# Patient Record
Sex: Female | Born: 1935 | Race: Black or African American | Hispanic: No | Marital: Married | Smoking: Never smoker
Health system: Southern US, Community
[De-identification: ages and names within clinical notes are randomized; demographics above are authoritative.]

## PROBLEM LIST (undated history)

## (undated) DIAGNOSIS — I4891 Unspecified atrial fibrillation: Secondary | ICD-10-CM

## (undated) DIAGNOSIS — R569 Unspecified convulsions: Secondary | ICD-10-CM

## (undated) DIAGNOSIS — I639 Cerebral infarction, unspecified: Secondary | ICD-10-CM

## (undated) DIAGNOSIS — I10 Essential (primary) hypertension: Secondary | ICD-10-CM

## (undated) HISTORY — PX: NO PAST SURGERIES: SHX2092

---

## 2009-05-17 ENCOUNTER — Emergency Department (HOSPITAL_COMMUNITY): Admission: EM | Admit: 2009-05-17 | Discharge: 2009-05-17 | Payer: Self-pay | Admitting: Emergency Medicine

## 2010-07-28 LAB — BASIC METABOLIC PANEL
CO2: 23 mEq/L (ref 19–32)
Calcium: 9.3 mg/dL (ref 8.4–10.5)
GFR calc Af Amer: 53 mL/min — ABNORMAL LOW (ref >60)
GFR calc non Af Amer: 44 mL/min — ABNORMAL LOW (ref >60)
Sodium: 139 mEq/L (ref 135–145)

## 2016-03-19 ENCOUNTER — Emergency Department (HOSPITAL_COMMUNITY): Payer: Medicare Other

## 2016-03-19 ENCOUNTER — Encounter (HOSPITAL_COMMUNITY): Payer: Self-pay | Admitting: Emergency Medicine

## 2016-03-19 ENCOUNTER — Inpatient Hospital Stay (HOSPITAL_COMMUNITY)
Admission: EM | Admit: 2016-03-19 | Discharge: 2016-03-25 | DRG: 064 | Disposition: A | Discharge status: Skilled Nursing Facility | Payer: Medicare Other | Attending: Internal Medicine | Admitting: Internal Medicine

## 2016-03-19 ENCOUNTER — Inpatient Hospital Stay (HOSPITAL_COMMUNITY): Payer: Medicare Other

## 2016-03-19 DIAGNOSIS — Z823 Family history of stroke: Secondary | ICD-10-CM | POA: Diagnosis not present

## 2016-03-19 DIAGNOSIS — H53461 Homonymous bilateral field defects, right side: Secondary | ICD-10-CM | POA: Diagnosis present

## 2016-03-19 DIAGNOSIS — R400 Somnolence: Secondary | ICD-10-CM | POA: Diagnosis not present

## 2016-03-19 DIAGNOSIS — Z8249 Family history of ischemic heart disease and other diseases of the circulatory system: Secondary | ICD-10-CM | POA: Diagnosis not present

## 2016-03-19 DIAGNOSIS — R238 Other skin changes: Secondary | ICD-10-CM | POA: Diagnosis present

## 2016-03-19 DIAGNOSIS — I69351 Hemiplegia and hemiparesis following cerebral infarction affecting right dominant side: Secondary | ICD-10-CM

## 2016-03-19 DIAGNOSIS — I6789 Other cerebrovascular disease: Secondary | ICD-10-CM | POA: Diagnosis not present

## 2016-03-19 DIAGNOSIS — R4701 Aphasia: Secondary | ICD-10-CM | POA: Diagnosis present

## 2016-03-19 DIAGNOSIS — K439 Ventral hernia without obstruction or gangrene: Secondary | ICD-10-CM | POA: Diagnosis present

## 2016-03-19 DIAGNOSIS — I609 Nontraumatic subarachnoid hemorrhage, unspecified: Secondary | ICD-10-CM | POA: Diagnosis present

## 2016-03-19 DIAGNOSIS — I63512 Cerebral infarction due to unspecified occlusion or stenosis of left middle cerebral artery: Secondary | ICD-10-CM | POA: Diagnosis not present

## 2016-03-19 DIAGNOSIS — S81801A Unspecified open wound, right lower leg, initial encounter: Secondary | ICD-10-CM | POA: Diagnosis not present

## 2016-03-19 DIAGNOSIS — G8194 Hemiplegia, unspecified affecting left nondominant side: Secondary | ICD-10-CM | POA: Diagnosis present

## 2016-03-19 DIAGNOSIS — Z7901 Long term (current) use of anticoagulants: Secondary | ICD-10-CM | POA: Diagnosis not present

## 2016-03-19 DIAGNOSIS — Z515 Encounter for palliative care: Secondary | ICD-10-CM | POA: Diagnosis present

## 2016-03-19 DIAGNOSIS — S99921A Unspecified injury of right foot, initial encounter: Secondary | ICD-10-CM | POA: Diagnosis present

## 2016-03-19 DIAGNOSIS — Z7189 Other specified counseling: Secondary | ICD-10-CM | POA: Diagnosis not present

## 2016-03-19 DIAGNOSIS — I130 Hypertensive heart and chronic kidney disease with heart failure and stage 1 through stage 4 chronic kidney disease, or unspecified chronic kidney disease: Secondary | ICD-10-CM | POA: Diagnosis present

## 2016-03-19 DIAGNOSIS — Z66 Do not resuscitate: Secondary | ICD-10-CM | POA: Diagnosis not present

## 2016-03-19 DIAGNOSIS — S9781XA Crushing injury of right foot, initial encounter: Secondary | ICD-10-CM | POA: Diagnosis present

## 2016-03-19 DIAGNOSIS — I63412 Cerebral infarction due to embolism of left middle cerebral artery: Secondary | ICD-10-CM | POA: Diagnosis present

## 2016-03-19 DIAGNOSIS — R1310 Dysphagia, unspecified: Secondary | ICD-10-CM | POA: Diagnosis present

## 2016-03-19 DIAGNOSIS — N183 Chronic kidney disease, stage 3 unspecified: Secondary | ICD-10-CM | POA: Diagnosis present

## 2016-03-19 DIAGNOSIS — I272 Pulmonary hypertension, unspecified: Secondary | ICD-10-CM | POA: Diagnosis present

## 2016-03-19 DIAGNOSIS — I4891 Unspecified atrial fibrillation: Secondary | ICD-10-CM | POA: Diagnosis not present

## 2016-03-19 DIAGNOSIS — D649 Anemia, unspecified: Secondary | ICD-10-CM | POA: Diagnosis present

## 2016-03-19 DIAGNOSIS — R569 Unspecified convulsions: Secondary | ICD-10-CM | POA: Diagnosis present

## 2016-03-19 DIAGNOSIS — I480 Paroxysmal atrial fibrillation: Secondary | ICD-10-CM | POA: Diagnosis present

## 2016-03-19 DIAGNOSIS — I639 Cerebral infarction, unspecified: Secondary | ICD-10-CM

## 2016-03-19 DIAGNOSIS — W19XXXD Unspecified fall, subsequent encounter: Secondary | ICD-10-CM | POA: Diagnosis not present

## 2016-03-19 DIAGNOSIS — M8580 Other specified disorders of bone density and structure, unspecified site: Secondary | ICD-10-CM | POA: Diagnosis present

## 2016-03-19 DIAGNOSIS — Z8673 Personal history of transient ischemic attack (TIA), and cerebral infarction without residual deficits: Secondary | ICD-10-CM

## 2016-03-19 DIAGNOSIS — I700 Atherosclerosis of aorta: Secondary | ICD-10-CM | POA: Diagnosis present

## 2016-03-19 DIAGNOSIS — W230XXA Caught, crushed, jammed, or pinched between moving objects, initial encounter: Secondary | ICD-10-CM | POA: Diagnosis present

## 2016-03-19 DIAGNOSIS — I4581 Long QT syndrome: Secondary | ICD-10-CM | POA: Diagnosis present

## 2016-03-19 DIAGNOSIS — S90521D Blister (nonthermal), right ankle, subsequent encounter: Secondary | ICD-10-CM | POA: Diagnosis not present

## 2016-03-19 DIAGNOSIS — E785 Hyperlipidemia, unspecified: Secondary | ICD-10-CM | POA: Diagnosis present

## 2016-03-19 DIAGNOSIS — S90521A Blister (nonthermal), right ankle, initial encounter: Secondary | ICD-10-CM | POA: Diagnosis not present

## 2016-03-19 DIAGNOSIS — I5022 Chronic systolic (congestive) heart failure: Secondary | ICD-10-CM | POA: Diagnosis present

## 2016-03-19 DIAGNOSIS — R0602 Shortness of breath: Secondary | ICD-10-CM

## 2016-03-19 DIAGNOSIS — R600 Localized edema: Secondary | ICD-10-CM

## 2016-03-19 HISTORY — DX: Unspecified convulsions: R56.9

## 2016-03-19 HISTORY — DX: Cerebral infarction, unspecified: I63.9

## 2016-03-19 HISTORY — DX: Essential (primary) hypertension: I10

## 2016-03-19 HISTORY — DX: Unspecified atrial fibrillation: I48.91

## 2016-03-19 LAB — RAPID URINE DRUG SCREEN, HOSP PERFORMED
AMPHETAMINES: NOT DETECTED
BENZODIAZEPINES: NOT DETECTED
Barbiturates: NOT DETECTED
Cocaine: NOT DETECTED
OPIATES: POSITIVE — AB
TETRAHYDROCANNABINOL: NOT DETECTED

## 2016-03-19 LAB — DIFFERENTIAL
Basophils Absolute: 0.1 K/uL (ref 0.0–0.1)
Basophils Relative: 1 %
Eosinophils Absolute: 0 K/uL (ref 0.0–0.7)
Eosinophils Relative: 0 %
Lymphocytes Relative: 10 %
Lymphs Abs: 1 K/uL (ref 0.7–4.0)
Monocytes Absolute: 0.5 K/uL (ref 0.1–1.0)
Monocytes Relative: 5 %
Neutro Abs: 8.2 K/uL — ABNORMAL HIGH (ref 1.7–7.7)
Neutrophils Relative %: 84 %

## 2016-03-19 LAB — CBC
HEMATOCRIT: 31.4 % — AB (ref 36.0–46.0)
Hemoglobin: 10.4 g/dL — ABNORMAL LOW (ref 12.0–15.0)
MCH: 29.7 pg (ref 26.0–34.0)
MCHC: 33.1 g/dL (ref 30.0–36.0)
MCV: 89.7 fL (ref 78.0–100.0)
PLATELETS: 241 10*3/uL (ref 150–400)
RBC: 3.5 MIL/uL — ABNORMAL LOW (ref 3.87–5.11)
RDW: 16.1 % — AB (ref 11.5–15.5)
WBC: 9.8 10*3/uL (ref 4.0–10.5)

## 2016-03-19 LAB — I-STAT TROPONIN, ED: Troponin i, poc: 0 ng/mL (ref 0.00–0.08)

## 2016-03-19 LAB — I-STAT CHEM 8, ED
BUN: 31 mg/dL — ABNORMAL HIGH (ref 6–20)
Calcium, Ion: 1.11 mmol/L — ABNORMAL LOW (ref 1.15–1.40)
Chloride: 110 mmol/L (ref 101–111)
Creatinine, Ser: 1.2 mg/dL — ABNORMAL HIGH (ref 0.44–1.00)
Glucose, Bld: 150 mg/dL — ABNORMAL HIGH (ref 65–99)
HCT: 34 % — ABNORMAL LOW (ref 36.0–46.0)
Hemoglobin: 11.6 g/dL — ABNORMAL LOW (ref 12.0–15.0)
Potassium: 4.2 mmol/L (ref 3.5–5.1)
Sodium: 142 mmol/L (ref 135–145)
TCO2: 19 mmol/L (ref 0–100)

## 2016-03-19 LAB — COMPREHENSIVE METABOLIC PANEL
ALBUMIN: 4.2 g/dL (ref 3.5–5.0)
ALT: 18 U/L (ref 14–54)
AST: 27 U/L (ref 15–41)
Alkaline Phosphatase: 52 U/L (ref 38–126)
Anion gap: 11 (ref 5–15)
BUN: 28 mg/dL — AB (ref 6–20)
CHLORIDE: 109 mmol/L (ref 101–111)
CO2: 20 mmol/L — AB (ref 22–32)
CREATININE: 1.29 mg/dL — AB (ref 0.44–1.00)
Calcium: 9.5 mg/dL (ref 8.9–10.3)
GFR calc Af Amer: 42 mL/min — ABNORMAL LOW (ref >60)
GFR calc non Af Amer: 36 mL/min — ABNORMAL LOW (ref >60)
GLUCOSE: 152 mg/dL — AB (ref 65–99)
Potassium: 4.2 mmol/L (ref 3.5–5.1)
SODIUM: 140 mmol/L (ref 135–145)
Total Bilirubin: 1 mg/dL (ref 0.3–1.2)
Total Protein: 6.7 g/dL (ref 6.5–8.1)

## 2016-03-19 LAB — URINE MICROSCOPIC-ADD ON

## 2016-03-19 LAB — URINALYSIS, ROUTINE W REFLEX MICROSCOPIC
BILIRUBIN URINE: NEGATIVE
Glucose, UA: NEGATIVE mg/dL
KETONES UR: NEGATIVE mg/dL
Leukocytes, UA: NEGATIVE
NITRITE: NEGATIVE
PH: 5 (ref 5.0–8.0)
Protein, ur: >300 mg/dL — AB
SPECIFIC GRAVITY, URINE: 1.022 (ref 1.005–1.030)

## 2016-03-19 LAB — ETHANOL

## 2016-03-19 LAB — CK: Total CK: 132 U/L (ref 38–234)

## 2016-03-19 LAB — APTT: aPTT: 31 s (ref 24–36)

## 2016-03-19 MED ORDER — METOPROLOL TARTRATE 5 MG/5ML IV SOLN
2.5000 mg | Freq: Once | INTRAVENOUS | Status: AC
  Administered 2016-03-19: 2.5 mg via INTRAVENOUS
  Filled 2016-03-19: qty 5

## 2016-03-19 MED ORDER — SENNOSIDES-DOCUSATE SODIUM 8.6-50 MG PO TABS
1.0000 | ORAL_TABLET | Freq: Every evening | ORAL | Status: DC | PRN
  Administered 2016-03-22: 1 via ORAL
  Filled 2016-03-19 (×2): qty 1

## 2016-03-19 MED ORDER — ISOSORBIDE MONONITRATE ER 30 MG PO TB24: 30.0000 mg | ORAL_TABLET | Freq: Every day | ORAL | Status: DC

## 2016-03-19 MED ORDER — SODIUM CHLORIDE 0.9 % IV SOLN
750.0000 mg | Freq: Two times a day (BID) | INTRAVENOUS | Status: DC
  Administered 2016-03-19 – 2016-03-21 (×4): 750 mg via INTRAVENOUS
  Filled 2016-03-19 (×5): qty 7.5

## 2016-03-19 MED ORDER — SODIUM CHLORIDE 0.9 % IV BOLUS (SEPSIS)
500.0000 mL | Freq: Once | INTRAVENOUS | Status: AC
  Administered 2016-03-19: 500 mL via INTRAVENOUS

## 2016-03-19 MED ORDER — ACETAMINOPHEN 650 MG RE SUPP: 650.0000 mg | RECTAL | Status: DC | PRN

## 2016-03-19 MED ORDER — MORPHINE SULFATE (PF) 4 MG/ML IV SOLN
4.0000 mg | Freq: Once | INTRAVENOUS | Status: AC
  Administered 2016-03-19: 4 mg via INTRAVENOUS
  Filled 2016-03-19: qty 1

## 2016-03-19 MED ORDER — SODIUM CHLORIDE 0.9 % IV SOLN
INTRAVENOUS | Status: DC
  Administered 2016-03-19 – 2016-03-21 (×3): via INTRAVENOUS

## 2016-03-19 MED ORDER — ONDANSETRON HCL 4 MG/2ML IJ SOLN
4.0000 mg | Freq: Once | INTRAMUSCULAR | Status: AC
  Administered 2016-03-19: 4 mg via INTRAVENOUS
  Filled 2016-03-19: qty 2

## 2016-03-19 MED ORDER — STROKE: EARLY STAGES OF RECOVERY BOOK
Freq: Once | Status: AC
  Administered 2016-03-19: 1
  Filled 2016-03-19: qty 1

## 2016-03-19 MED ORDER — LEVETIRACETAM 750 MG PO TABS: 750.0000 mg | ORAL_TABLET | Freq: Two times a day (BID) | ORAL | Status: DC

## 2016-03-19 MED ORDER — CARVEDILOL 3.125 MG PO TABS
3.1250 mg | ORAL_TABLET | Freq: Two times a day (BID) | ORAL | Status: DC
  Administered 2016-03-20 – 2016-03-24 (×9): 3.125 mg via ORAL
  Filled 2016-03-19 (×9): qty 1

## 2016-03-19 MED ORDER — ACETAMINOPHEN 325 MG PO TABS
650.0000 mg | ORAL_TABLET | ORAL | Status: DC | PRN
  Administered 2016-03-22 – 2016-03-25 (×2): 650 mg via ORAL
  Filled 2016-03-19 (×2): qty 2

## 2016-03-19 NOTE — H&P (Signed)
Date: 03/19/2016               Patient Name:  Jodi Haney MRN: [id number]  DOB: [birth year] Age / Sex: 82 y.o., female   PCP: No primary care provider on file.         Medical Service: Internal Medicine Teaching Service         Attending Physician: Dr. Gust RungErik C Hoffman, DO    First Contact: Dr. Laural BenesJohnson Pager: [phone]  Second Contact: Dr. Johnny BridgeSaraiya Pager: [phone]       After Hours (After 5p/  First Contact Pager: [phone]  weekends / holidays): Second Contact Pager: [phone]   Chief Complaint: fall  History of Present Illness: 82 year old woman with HTN, atrial fibrillation on Eliquis, CHF, seizures, TIA presenting after being found on ground by family members. [place] from [place], [place] where she lives with her daughter and husband. She went to bed at 10PM last night - last known normal. She was found this morning face down on the ground. She was conscious at the time and appeared to attempt to talk but was nonverbal. At baseline she is independent and does not use assistive devices. She does get short of breath with exertion.  Meds:  Current Meds  Medication Sig  . amLODipine (NORVASC) 5 MG tablet Take 5 mg by mouth daily.  Marland Kitchen. apixaban (ELIQUIS) 2.5 MG TABS tablet Take 2.5 mg by mouth 2 (two) times daily.  . carvedilol (COREG) 3.125 MG tablet Take 3.125 mg by mouth 2 (two) times daily with a meal.  . isosorbide mononitrate (IMDUR) 30 MG 24 hr tablet Take 30 mg by mouth daily.  Marland Kitchen. levETIRAcetam (KEPPRA) 500 MG tablet Take 750 mg by mouth 2 (two) times daily.  . magnesium oxide (MAG-OX) 400 MG tablet Take 400 mg by mouth daily.  . Polyvinyl Alcohol-Povidone (REFRESH OP) Apply 1 drop to eye daily.     Allergies: Allergies as of 03/19/2016  . (No Known Allergies)   Past Medical History:  Diagnosis Date  . Atrial fibrillation (HCC)     Family History:  Mother died of CVA Two brothers had CVA Multiple family members with heart disease, HTN.  Social History:   No tobacco, no alcohol, no illicits  Review of Systems: Unable to obtain due to nonverbal status.  Physical Exam: Blood pressure 131/89, pulse (!) 122, temperature 98.6 F (37 C), temperature source Oral, resp. rate 17, height 5\' 5"  (1.651 m), weight 200 lb (90.7 kg), SpO2 94 %. General Apperance: NAD Head: Normocephalic, atraumatic Eyes: PERRL, EOMI, anicteric sclera Ears: Normal external ear canal Nose: Nares normal, septum midline, mucosa normal Throat: Lips, mucosa and tongue normal  Neck: Supple, trachea midline Back: No tenderness or bony abnormality  Lungs: Clear to auscultation bilaterally. No wheezes, rhonchi or rales. Breathing comfortably Chest Wall: Nontender, no deformity Heart: Regular rate and rhythm, no murmur/rub/gallop Abdomen: Soft, nontender, nondistended, no rebound/guarding Extremities: RLE with large blisters and dusky skin just above ankle. RLE edematous as well.  Pulses: R DP/PT nonpalpable, 2+ L DP/PT Skin: see above Neurologic: Alert. Does not follow commands. Nonverbal. Pupils round and reactive bilaterally. Appears to have right visual field neglect. Left gaze preference. EOMI. Moves left upper and lower extremity spontaneously. R arm does not move. R leg moves to stimuli.   EKG: atrial fibrillation, T wave inversions in anterior leads, QTc 494. No previous EKG for comparison  CXR: cardiomegaly  Assessment & Plan by Problem: 82 year old woman with HTN, atrial  fibrillation on Eliquis, CHF, seizures, TIA presenting after being found on ground by family members found to have CVA.  Left MCA CVA: She is nonverbal and unable to follow commands. Decreased movement in R UE and LE. MR/MRA brain 6-7cm region of infarction in the left MCA territory affecting deep insula, frontoparietal and parietal region. Punctate acute infraction in right cerebllum and right occipital lobe. Small amount of small subdural or subarachnoid blood on right. She was seen in  consultation by neurology. -Admitted to telemetry -If she continues on anticoagulation in the future, may need to consider Coumadin. Family would like to have further goals of care discussion when her daughter arrives. -Hemoglobin A1c, fasting lipid panel -Echo -Carotid Dopplers -Hold off on antiplatelet therapy for 24 hours -PT/OT/SLP  Fall: CT C-spine with no acute abnormality. CT right hip with no acute osseous findings. Right humerus x-ray negative. Right wrist x-ray with no acute findings. Chest x-ray unremarkable. -PT/OT  Right lower extremity wound: Blistering above right ankle. No evidence of cellulitis. Right ankle x-ray with soft tissue swelling with air/gas in the soft tissues laterally. No focal bone findings. She was seen in consultation by orthopedic surgery who drained the blisters. Xeroform to be placed over the wounds afterwards.  Atrial fibrillation: CHA2DS2-VASc of at least 7. -Continue home carvedilol -Will need further discussion on anticoagulation.  Renal insufficiency: Uncertain if acute or chronic since she does not have any labs in EMR since 2011. Creatinine 1.29 on admission. -Normal saline @ 12500ml/hr while NPO  VTE ppx: SCDs FEN: NPO, NS@100ml /hr Code status: Discussed with her three sons and has expressed in the past that she wouldn't want to be DO NOT RESUSCITATE. We'll continue full scope of care otherwise with continued discussion of goals of care. She does not have a healthcare power of attorney. Her sons would like to make decisions as a group.  Dispo: Admit patient to Inpatient with expected length of stay greater than 2 midnights.  Signed: Lora PaulaJennifer T Krall, MD 03/19/2016, 3:17 PM  Pager: [phone]

## 2016-03-19 NOTE — ED Notes (Signed)
Pt returned from CT and placed back on the monitor by Joselyn Glassmanyler, EMT

## 2016-03-19 NOTE — ED Notes (Signed)
Admitting MD at the bedside.  

## 2016-03-19 NOTE — Consult Note (Signed)
Neurology Consultation Reason for Consult: L MCA stroke Referring Physician: ED Provider  CC:   History is obtained from: family members  HPI: Jodi Haney is a 82 y.o. female with hx of Afib, HTN, seizures, who was found on the floor by her family members this morning around 5 AM. Last known normal last night around 10 pm when she was doing well. She is visiting her son from [name]. This morning around 5 AM her family found her on the floor beside her bed nonverbal. Has not been moving her right arm. She usually is independent, lives with her daughter in [name] along with pt's husband who has dementia. Pt's husband was just recently transferred to a Nursing home. 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 Has some difficulty walking due to knee pain but no falls, no assistive device. Does recently get SOB with exertion easily even after walking 10 feet. No formal dx of CHF.   Takes 4x baby aspirin daily. Also takes eliquis for her afib.    LKW: last night around 10 pm.  tpa given?: no, out of window.  Premorbid modified rankin scale: 2   Past Medical History:  Diagnosis Date  . Atrial fibrillation (HCC)     No family history on file.  Social History:  has no tobacco, alcohol, and drug history on file.  Exam: Current vital signs: BP (!) 166/105   Pulse (!) 135   Temp 98.6 F (37 C) (Oral)   Resp 13   Ht 5\' 5"  (1.651 m)   Wt 200 lb (90.7 kg)   SpO2 99%   BMI 33.28 kg/m  Vital signs in last 24 hours: Temp:  [98.6 F (37 C)] 98.6 F (37 C) (11/08 0721) Pulse Rate:  [131-139] 135 (11/08 0800) Resp:  [13-16] 13 (11/08 0800) BP: (152-166)/(93-113) 166/105 (11/08 0800) SpO2:  [96 %-99 %] 99 % (11/08 0800) Weight:  [200 lb (90.7 kg)] 200 lb (90.7 kg) (11/08 0629)   Physical Exam  Constitutional: awake, non verbal, elderly lady.  Eyes: brusing noted around right eye.  HENT: No OP obstrucion Head: Normocephalic.  Cardiovascular: Normal rate and regular rhythm.   Respiratory: Effort normal and breath sounds normal to anterior ascultation GI: Soft.  No distension. There is no tenderness.  Ext: has blisters over her right foot/ankle area. Right feet is cooler to touch and edematous.   Neuro: Mental Status: Patient is awake, nonverbal. Does not follow commands. No neglect.  Cranial Nerves: II: pupils are round, equal, but sluggish. Unable to assess visual field but no neglect.  III,IV, VI: EOM seems to be intact as she is looking around on her own but does not follow command to assess fully. V: unable to assess. VII:  unable to assess. VIII:  unable to assess. X:  unable to assess. XI:  unable to assess. XII:  unable to assess. Motor: Unable to assess str. Able to hold up left arm and left leg. Moves right leg to painful stimuli, not able to hold up against gravity. Does not move right arm.  Sensory: Unable to assess.  Plantars: Toes upgoing on right, down going on left.  Cerebellar: Unable to assess.    I have reviewed labs in epic and the results pertinent to this consultation are: crt 1.20, GFR 42. hgb 10.4,   I have reviewed the images obtained:  CT head showing moderate sized acute L MCA infarct. Small extra-axial hemorrhage over right cerebral convexity.   Impression:   82 yo female with  hx of AFib, hx of CVA in the past, some mild limitation of her activity at baseline is here with moderate L MCA stroke seen on CT. We discussed with the family regarding doing CT perfusion study to look for any penumbra and IR procedure. We made the decision that it would be too risky for her to go through that without much benefit to her as she already has moderate sized L MCA territory infarct affecting her R side motor and speech areas. This is likely embolic from her Afib, could be Eliquis failure?  Recommendations:  1. HgbA1c, fasting lipid panel 2. MRI, MRA  of the brain without contrast 3. Frequent neuro checks 4. Echocardiogram 5.  Carotid dopplers 6. Prophylactic therapy-Antiplatelet med: cont asa full dose. Consider switching from Eliquis to coumadin? 7. Risk factor modification 8. Telemetry monitoring 9. PT consult, OT consult, Speech consult 10. please page stroke NP  Or  PA  Or MD  M-F from 8am -4 pm starting 03/20/16 as this patient will be followed by the stroke team at this point.   You can look them up on [email or web address]     Hyacinth Meekerasrif Elior Robinette, M.D. PGY-3.

## 2016-03-19 NOTE — ED Notes (Signed)
Pt returned from CT °

## 2016-03-19 NOTE — Consult Note (Addendum)
WOC Nurse wound consult note Reason for Consult: Consult requested for right ankle blistering.  X-rays indicate: Marked regional soft tissue swelling. Air/ gas in the soft tissues lateral to the ankle region.  This is beyond the WOC scope of practice; please refer to ortho service for further plan of care.   EMR indicates that ortho consult has been requested and is pending. Please re-consult if further assistance is needed.  Thank-you,  Cammie Mcgeeawn Quinntin Malter MSN, RN, CWOCN, [place]AP, CNS [phone]

## 2016-03-19 NOTE — ED Notes (Signed)
Orthopedics at the bedside. Drained blisters and assessed right foot. Requested to put xeroform over wounds once they were finished draining

## 2016-03-19 NOTE — ED Triage Notes (Signed)
Pt in EMS from sons house, pt was found on floor this am approx 0555, LNW 2200 last night. Pt has complete R side paralysis, aphasic, gaze. Hypertensive and tachycardic, afib on monitor. EDP is at bedside

## 2016-03-19 NOTE — ED Notes (Signed)
Pt being taken to Xray

## 2016-03-19 NOTE — Consult Note (Signed)
ORTHOPAEDIC CONSULTATION  REQUESTING PHYSICIAN: Gust Rung, DO  PCP:  No primary care provider on file.  Chief Complaint: Right foot/ankle blistering  HPI: Jodi Haney is a 82 y.o. female who is s/p recent CVA and non verbal at this time in the Ed.  She was found this am around 530 am after not being heard from since 10 pm the prior night.  She was found down in a prone position by her son and was unable to move the right side and not able to communicate.  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  Per her eldest son she was fine up until last night.  History is obtained from chart review and interview with her son.  Past Medical History:  Diagnosis Date  . Atrial fibrillation (HCC)    No past surgical history on file. Social History   Social History  . Marital status: Married    Spouse name: N/A  . Number of children: N/A  . Years of education: N/A   Social History Main Topics  . Smoking status: Not on file  . Smokeless tobacco: Not on file  . Alcohol use Not on file  . Drug use: Unknown  . Sexual activity: Not on file   Other Topics Concern  . Not on file   Social History Narrative  . No narrative on file   No family history on file. No Known Allergies Prior to Admission medications   Medication Sig Start Date End Date Taking? Authorizing Provider  amLODipine (NORVASC) 5 MG tablet Take 5 mg by mouth daily.   Yes Historical Provider, MD  apixaban (ELIQUIS) 2.5 MG TABS tablet Take 2.5 mg by mouth 2 (two) times daily.   Yes Historical Provider, MD  carvedilol (COREG) 3.125 MG tablet Take 3.125 mg by mouth 2 (two) times daily with a meal.   Yes Historical Provider, MD  isosorbide mononitrate (IMDUR) 30 MG 24 hr tablet Take 30 mg by mouth daily.   Yes Historical Provider, MD  levETIRAcetam (KEPPRA) 500 MG tablet Take 750 mg by mouth 2 (two) times daily.   Yes Historical Provider, MD  magnesium oxide (MAG-OX) 400 MG tablet Take 400 mg by mouth  daily.   Yes Historical Provider, MD  Polyvinyl Alcohol-Povidone (REFRESH OP) Apply 1 drop to eye daily.   Yes Historical Provider, MD   Dg Chest 1 View  Result Date: 03/19/2016 CLINICAL DATA:  Altered mental status with stroke symptoms. EXAM: CHEST 1 VIEW COMPARISON:  None. FINDINGS: Enlargement of the cardiac silhouette. Lungs are clear without pulmonary edema or focal airspace disease. Negative for a pneumothorax. Trachea is mildly deviated towards the right. No acute bone abnormality. IMPRESSION: Cardiomegaly without focal lung disease. Electronically Signed   By: Richarda Overlie M.D.   On: 03/19/2016 07:44   Dg Wrist Complete Right  Result Date: 03/19/2016 CLINICAL DATA:  Altered mental status.  Swelling and bruising. EXAM: RIGHT WRIST - COMPLETE 3+ VIEW COMPARISON:  None. FINDINGS: There chronic osteoarthritic changes at the articulation between the navicular and the multangular bones. No evidence of fracture or dislocation. IMPRESSION: Radial side carpal arthritic changes. No acute or traumatic finding. Electronically Signed   By: Paulina Fusi M.D.   On: 03/19/2016 09:18   Dg Ankle Complete Right  Result Date: 03/19/2016 CLINICAL DATA:  Bruising of the foot and ankle with blisters. EXAM: RIGHT ANKLE - COMPLETE 3+ VIEW COMPARISON:  None. FINDINGS: Marked regional soft tissue swelling. Air/ gas  in the soft tissues lateral to the ankle region. The bones are osteopenic. No evidence of focal destruction or lysis. IMPRESSION: Soft-tissue swelling with air/ gas in the soft tissues laterally. No focal bone finding. Electronically Signed   By: Paulina FusiMark  Shogry M.D.   On: 03/19/2016 09:17   Ct Head Wo Contrast  Result Date: 03/19/2016 CLINICAL DATA:  Found on floor this morning with leftward gaze and aphasia. Concern for stroke. EXAM: CT HEAD WITHOUT CONTRAST CT CERVICAL SPINE WITHOUT CONTRAST TECHNIQUE: Multidetector CT imaging of the head and cervical spine was performed following the standard protocol  without intravenous contrast. Multiplanar CT image reconstructions of the cervical spine were also generated. COMPARISON:  None. FINDINGS: CT HEAD FINDINGS Brain: There is hypoattenuation involving the posterior left insula and cortex and white matter in the left frontoparietal region most consistent with acute moderate-sized MCA territory infarct. The there is prominence of the extra-axial CSF spaces over both frontal lobes which may reflect the atrophy or small subdural hygromas. Overlying the right cerebral hemisphere in the frontal and parietal regions are small foci of extra-axial hemorrhage which are favored to be subarachnoid though a a component of subdural hemorrhage is possible. The ventricles are normal in size for age. There is no evidence of mass or midline shift. Periventricular white matter hypodensities are nonspecific but compatible with chronic small vessel ischemic disease. There is a chronic lacunar infarct in the right caudate head. Vascular: No hyperdense vessel. Calcified atherosclerosis at the skullbase. Skull: No fracture or suspicious osseous lesion. Sinuses/Orbits: Left maxillary sinus mucous retention cyst. Minimal left frontal and left ethmoid sinus mucosal thickening. Visualized mastoid air cells are clear. Unremarkable orbits. Other: None. CT CERVICAL SPINE FINDINGS Alignment: Cervical spine straightening.  No subluxation. Skull base and vertebrae: No acute fracture or destructive osseous lesion. Incidental incomplete fusion of the C1 posterior ring. Soft tissues and spinal canal: No prevertebral fluid or swelling. No visible canal hematoma. Disc levels: Mild disc space narrowing at C3-4 with mild-to-moderate narrowing from C4-5 to C6-7. Upper chest: Unremarkable. Other: Partially retropharyngeal course of the carotid arteries. IMPRESSION: 1. Moderate-sized acute left MCA infarct. 2. Small volume acute extra-axial hemorrhage over the right cerebral convexity, likely subarachnoid. 3.  Enlarged CSF spaces over the frontal lobes, likely secondary to atrophy though small subdural hygromas are also possible. 4. No evidence of acute osseous abnormality in the cervical spine. These results were called by telephone at the time of interpretation on 03/19/2016 at 7:59 am to Dr. Donnald GarrePfeiffer, who verbally acknowledged these results. Electronically Signed   By: Sebastian AcheAllen  Grady M.D.   On: 03/19/2016 08:02   Ct Cervical Spine Wo Contrast  Result Date: 03/19/2016 CLINICAL DATA:  Found on floor this morning with leftward gaze and aphasia. Concern for stroke. EXAM: CT HEAD WITHOUT CONTRAST CT CERVICAL SPINE WITHOUT CONTRAST TECHNIQUE: Multidetector CT imaging of the head and cervical spine was performed following the standard protocol without intravenous contrast. Multiplanar CT image reconstructions of the cervical spine were also generated. COMPARISON:  None. FINDINGS: CT HEAD FINDINGS Brain: There is hypoattenuation involving the posterior left insula and cortex and white matter in the left frontoparietal region most consistent with acute moderate-sized MCA territory infarct. The there is prominence of the extra-axial CSF spaces over both frontal lobes which may reflect the atrophy or small subdural hygromas. Overlying the right cerebral hemisphere in the frontal and parietal regions are small foci of extra-axial hemorrhage which are favored to be subarachnoid though a a component of subdural hemorrhage  is possible. The ventricles are normal in size for age. There is no evidence of mass or midline shift. Periventricular white matter hypodensities are nonspecific but compatible with chronic small vessel ischemic disease. There is a chronic lacunar infarct in the right caudate head. Vascular: No hyperdense vessel. Calcified atherosclerosis at the skullbase. Skull: No fracture or suspicious osseous lesion. Sinuses/Orbits: Left maxillary sinus mucous retention cyst. Minimal left frontal and left ethmoid sinus mucosal  thickening. Visualized mastoid air cells are clear. Unremarkable orbits. Other: None. CT CERVICAL SPINE FINDINGS Alignment: Cervical spine straightening.  No subluxation. Skull base and vertebrae: No acute fracture or destructive osseous lesion. Incidental incomplete fusion of the C1 posterior ring. Soft tissues and spinal canal: No prevertebral fluid or swelling. No visible canal hematoma. Disc levels: Mild disc space narrowing at C3-4 with mild-to-moderate narrowing from C4-5 to C6-7. Upper chest: Unremarkable. Other: Partially retropharyngeal course of the carotid arteries. IMPRESSION: 1. Moderate-sized acute left MCA infarct. 2. Small volume acute extra-axial hemorrhage over the right cerebral convexity, likely subarachnoid. 3. Enlarged CSF spaces over the frontal lobes, likely secondary to atrophy though small subdural hygromas are also possible. 4. No evidence of acute osseous abnormality in the cervical spine. These results were called by telephone at the time of interpretation on 03/19/2016 at 7:59 am to Dr. Donnald Garre, who verbally acknowledged these results. Electronically Signed   By: Sebastian Ache M.D.   On: 03/19/2016 08:02   Mr Brain Wo Contrast  Result Date: 03/19/2016 CLINICAL DATA:  Found unresponsive on the floor this morning with right-sided paralysis and aphasia. EXAM: MRI HEAD WITHOUT CONTRAST MRA HEAD WITHOUT CONTRAST TECHNIQUE: Multiplanar, multiecho pulse sequences of the brain and surrounding structures were obtained without intravenous contrast. Angiographic images of the head were obtained using MRA technique without contrast. COMPARISON:  Head CT same day FINDINGS: MRI HEAD FINDINGS Brain: Punctate acute infarction in the right superior cerebellum. Punctate acute infarction in the right occipital lobe. Confluent acute infarction affecting a the 6-7 cm region of the left cerebral hemisphere in the MCA territory affecting the deep insula, frontoparietal and parietal region. Mild swelling  but no hemorrhage or shift at this time. Small amount of acute subarachnoid or subdural hemorrhage along the convexity on the right, presumably secondary to a fall. Background pattern of mild to moderate chronic small-vessel change within the white matter. Vascular: Major vessels at the base of the brain show flow. See below. Skull and upper cervical spine: Negative Sinuses/Orbits: Left maxillary sinus retention cyst. No inflammatory disease. Other: None significant MRA HEAD FINDINGS Markedly degraded by motion. Antegrade flow present within the internal carotid artery's through the skullbase. Flow is seen in the proximal anterior and middle cerebral vessels bilaterally. Both vertebral arteries show flow to the basilar. Flow is present in both posterior cerebral arteries. IMPRESSION: 6-7 cm region of confluent infarction in the left MCA territory affecting the deep insula, frontoparietal and parietal region. Mild swelling but no hemorrhage. Consistent with posterior division MCA branch vessel occlusion. Punctate acute infarction in the right cerebellum an the right occipital lobe. Therefore, concern regarding embolic disease from the heart or ascending aorta. Small amount of subdural or subarachnoid blood on the right probably related to a fall. Markedly degraded MRA. Major vessels at the base of the brain show flow. Electronically Signed   By: Paulina Fusi M.D.   On: 03/19/2016 12:25   Ct Hip Right Wo Contrast  Result Date: 03/19/2016 CLINICAL DATA:  Right hip pain status post fall today. EXAM:  CT OF THE RIGHT HIP WITHOUT CONTRAST TECHNIQUE: Multidetector CT imaging of the right hip was performed according to the standard protocol. Multiplanar CT image reconstructions were also generated. COMPARISON:  Radiographs same date. FINDINGS: Examination is limited to the right hip and inferior right hemipelvis. Bones/Joint/Cartilage The mineralization and alignment are normal. There is no evidence of acute fracture,  dislocation or femoral head avascular necrosis. No significant hip arthropathy for age. Mild degenerative changes are present within the right sacroiliac joint and visualized lower lumbar spine. Ligaments Not relevant for exam/indication. Muscles and Tendons Unremarkable. Soft tissues No evidence of periarticular hematoma or other fluid collection. There is mild atherosclerosis of the right iliac and femoral vasculature. There is a large ventral hernia containing colon, incompletely visualized. No signs of obstruction by this examination. IMPRESSION: 1. No acute osseous findings demonstrated at the right hip. 2. Underlying mild degenerative changes within the visualized lower lumbar spine and right sacroiliac joint. 3. Large ventral hernia containing colon, incompletely visualized. Electronically Signed   By: Carey BullocksWilliam  Veazey M.D.   On: 03/19/2016 09:57   Dg Humerus Right  Result Date: 03/19/2016 CLINICAL DATA:  Bruising.  No laceration. EXAM: RIGHT HUMERUS - 2+ VIEW COMPARISON:  None. FINDINGS: There is no evidence of fracture or other focal bone lesions. Soft tissues are unremarkable. IMPRESSION: Negative. Electronically Signed   By: Paulina FusiMark  Shogry M.D.   On: 03/19/2016 09:19   Mr Maxine GlennMra Head/brain RUWo Cm  Result Date: 03/19/2016 CLINICAL DATA:  Found unresponsive on the floor this morning with right-sided paralysis and aphasia. EXAM: MRI HEAD WITHOUT CONTRAST MRA HEAD WITHOUT CONTRAST TECHNIQUE: Multiplanar, multiecho pulse sequences of the brain and surrounding structures were obtained without intravenous contrast. Angiographic images of the head were obtained using MRA technique without contrast. COMPARISON:  Head CT same day FINDINGS: MRI HEAD FINDINGS Brain: Punctate acute infarction in the right superior cerebellum. Punctate acute infarction in the right occipital lobe. Confluent acute infarction affecting a the 6-7 cm region of the left cerebral hemisphere in the MCA territory affecting the deep insula,  frontoparietal and parietal region. Mild swelling but no hemorrhage or shift at this time. Small amount of acute subarachnoid or subdural hemorrhage along the convexity on the right, presumably secondary to a fall. Background pattern of mild to moderate chronic small-vessel change within the white matter. Vascular: Major vessels at the base of the brain show flow. See below. Skull and upper cervical spine: Negative Sinuses/Orbits: Left maxillary sinus retention cyst. No inflammatory disease. Other: None significant MRA HEAD FINDINGS Markedly degraded by motion. Antegrade flow present within the internal carotid artery's through the skullbase. Flow is seen in the proximal anterior and middle cerebral vessels bilaterally. Both vertebral arteries show flow to the basilar. Flow is present in both posterior cerebral arteries. IMPRESSION: 6-7 cm region of confluent infarction in the left MCA territory affecting the deep insula, frontoparietal and parietal region. Mild swelling but no hemorrhage. Consistent with posterior division MCA branch vessel occlusion. Punctate acute infarction in the right cerebellum an the right occipital lobe. Therefore, concern regarding embolic disease from the heart or ascending aorta. Small amount of subdural or subarachnoid blood on the right probably related to a fall. Markedly degraded MRA. Major vessels at the base of the brain show flow. Electronically Signed   By: Paulina FusiMark  Shogry M.D.   On: 03/19/2016 12:25   Dg Hip Unilat W Or Wo Pelvis 2-3 Views Right  Result Date: 03/19/2016 CLINICAL DATA:  Stroke. EXAM: DG HIP (WITH OR  WITHOUT PELVIS) 2-3V RIGHT COMPARISON:  No recent prior. FINDINGS: Degenerative changes lumbar spine and both hips. Diffuse osteopenia. Surgical sutures in the pelvis. Pelvic calcifications consistent with phleboliths. IMPRESSION: No acute or focal abnormality. Diffuse osteopenia and degenerative change. Electronically Signed   By: Maisie Fus  Register   On: 03/19/2016  07:42    Positive ROS: All other systems have been reviewed and were otherwise negative with the exception of those mentioned in the HPI and as above.  Physical Exam: General: Alert, no acute distress Cardiovascular: 2+ pedal edema Respiratory: No cyanosis, no use of accessory musculature GI: No organomegaly, abdomen is soft and non-tender Skin: ecchymosis and bullae over the right ankle Neurologic: responds to voice and pain in all 4 extremity Psychiatric: Patient is alert, aphasic Lymphatic: No axillary or cervical lymphadenopathy  MUSCULOSKELETAL:  RLE- Multiple bullae over the ankle circumfirentially with blood and serous fluid.  Skin wwp, she responds to pain, unable to voluntarily move the limb  Imaging- EXAM: RIGHT ANKLE - COMPLETE 3+ VIEW  COMPARISON:  None.  FINDINGS: Marked regional soft tissue swelling. Air/ gas in the soft tissues lateral to the ankle region. The bones are osteopenic. No evidence of focal destruction or lysis.  IMPRESSION: Soft-tissue swelling with air/ gas in the soft tissues laterally. No focal bone finding.  Assessment: Right foot crush injury and blistering  Plan: -blisters decompressed at bedside -leave skin intact over the blisters for biologic dressing -would dress leg with xeroform and overwrap with ACE to be changed 1 time daily -the xrays are negative and this appears to be a soft tissue condition, no surgery indicated and no further ortho involvement needed -skin should heal on its on  -call with ?s    Yolonda Kida, MD Cell [phone]    03/19/2016 1:51 PM

## 2016-03-19 NOTE — ED Notes (Signed)
Pt being taken to MRI 

## 2016-03-19 NOTE — ED Notes (Signed)
Pt returned from xray

## 2016-03-19 NOTE — ED Notes (Addendum)
Pt is an 82 y.o. Female coming from home with her sons where she is visiting from [name]. Pt normally lives with her daughter in [name], but was visiting her sons. Patient was LSN with daughter-in-law last night at 2200. Son went to check on mother at [id number] when he found her on the floor. Pt was flaccid on the right side and unable to talk with the son. Pt was seen by EMS. Pt was brought in and CT showed left MCA infarct and some hemorrhage. Original NIH was 25, Last NIH 24. Next neuro needed at 1700. Patient failed Swallow Screen. Held patient's Keppra due to NPO at this time. NO other medications were missed. Pt has blisters and swelling noted to the right ankle and foot. Orthopedic came down and lanced the blister, requested to let them drain. After draining, pt was to have the wound dressed with xeroform. After lancing, this RN noticed some new blisters forming. Bruising noted to the lateral foot. Pulses were identified with doppler and foot cool to touch. Pt had sensory noted to the right leg when lancing was taken place. Pt is incontinent and was cleaned before going to the floor. No stool noted while this RN had care of patient. Pt's family at the bedside.

## 2016-03-19 NOTE — ED Notes (Signed)
Pt to CT

## 2016-03-19 NOTE — ED Notes (Signed)
Pt being taken to CT.

## 2016-03-19 NOTE — ED Notes (Signed)
Patient came back from MRI.

## 2016-03-19 NOTE — ED Provider Notes (Signed)
[facility]EMERGENCY DEPT Provider Note   CSN: [id number] Arrival date & time: 03/19/16  0865     History   Chief Complaint Chief Complaint  Patient presents with  . Cerebrovascular Accident    HPI Jodi Haney is a 82 y.o. female.  Patient with h/o atrial fibrillation on Eliquis, seizures on Keppra -- presents with acute onset L-sided weakness and aphasia. Last known well 10pm last night (>8 hrs prior to arrival). Found on floor by son who she is visiting from [place] this morning. EMS called. Level V caveat as patient is aphasic.   Per family, patient found prone on floor with right arm extended outward.       Past Medical History:  Diagnosis Date  . Atrial fibrillation (HCC)     There are no active problems to display for this patient.   No past surgical history on file.  OB History    No data available       Home Medications    Prior to Admission medications   Medication Sig Start Date End Date Taking? Authorizing Provider  amLODipine (NORVASC) 5 MG tablet Take 5 mg by mouth daily.   Yes Historical Provider, MD  apixaban (ELIQUIS) 2.5 MG TABS tablet Take 2.5 mg by mouth 2 (two) times daily.   Yes Historical Provider, MD  carvedilol (COREG) 3.125 MG tablet Take 3.125 mg by mouth 2 (two) times daily with a meal.   Yes Historical Provider, MD  isosorbide mononitrate (IMDUR) 30 MG 24 hr tablet Take 30 mg by mouth daily.   Yes Historical Provider, MD  levETIRAcetam (KEPPRA) 500 MG tablet Take 750 mg by mouth 2 (two) times daily.   Yes Historical Provider, MD  magnesium oxide (MAG-OX) 400 MG tablet Take 400 mg by mouth daily.   Yes Historical Provider, MD  Polyvinyl Alcohol-Povidone (REFRESH OP) Apply 1 drop to eye daily.   Yes Historical Provider, MD    Family History No family history on file.  Social History Social History  Substance Use Topics  . Smoking status: Not on file  . Smokeless tobacco: Not on file  . Alcohol use Not on file     Allergies     Patient has no allergy information on record.   Review of Systems Review of Systems  Unable to perform ROS: Patient nonverbal     Physical Exam Updated Vital Signs BP (!) 158/113 (BP Location: Left Arm)   Pulse (!) 136   Temp 98.6 F (37 C) (Oral)   Resp 16   Ht 5\' 5"  (1.651 m)   Wt 90.7 kg   SpO2 96%   BMI 33.28 kg/m   Physical Exam  Constitutional: She appears well-developed and well-nourished.  HENT:  Head: Normocephalic and atraumatic.  Mouth/Throat: Oropharynx is clear and moist.  Eyes: Conjunctivae are normal. Pupils are equal, round, and reactive to light. Right eye exhibits no discharge. Left eye exhibits no discharge.  Neck: Normal range of motion. Neck supple.  Cardiovascular: Normal heart sounds.  An irregularly irregular rhythm present. Tachycardia present.   Pulses:      Dorsalis pedis pulses are 0 on the right side, and 2+ on the left side.       Posterior tibial pulses are 0 on the right side, and 2+ on the left side.  Tachycardia, both PT and DP pulses are found with doppler on right.   Pulmonary/Chest: Effort normal and breath sounds normal. No respiratory distress. She has no wheezes.  Abdominal: Soft. She exhibits  no mass. There is no tenderness. There is no guarding.  Musculoskeletal: She exhibits tenderness. She exhibits no edema.       Right hip: She exhibits tenderness.  Foreshortening and external rotation R LE. Bruising noted of right wrist. R ankle is edematous and cool with bruising and bullae noted.   Neurological:  Patient with apparent R-sided upper and lower extremity weakness. Expressive aphasia, suspect some element of receptive aphasia as she can follow very basic commands, at times, like squeezing fingers on left. This is not consistent. She cannot converse. L sided gaze deviation. R facial droop.   Skin: Skin is warm and dry.  Psychiatric: She has a normal mood and affect.  Nursing note and vitals reviewed.    ED Treatments /  Results  Labs (all labs ordered are listed, but only abnormal results are displayed) Labs Reviewed  CBC - Abnormal; Notable for the following:       Result Value   RBC 3.50 (*)    Hemoglobin 10.4 (*)    HCT 31.4 (*)    RDW 16.1 (*)    All other components within normal limits  DIFFERENTIAL - Abnormal; Notable for the following:    Neutro Abs 8.2 (*)    All other components within normal limits  COMPREHENSIVE METABOLIC PANEL - Abnormal; Notable for the following:    CO2 20 (*)    Glucose, Bld 152 (*)    BUN 28 (*)    Creatinine, Ser 1.29 (*)    GFR calc non Af Amer 36 (*)    GFR calc Af Amer 42 (*)    All other components within normal limits  I-STAT CHEM 8, ED - Abnormal; Notable for the following:    BUN 31 (*)    Creatinine, Ser 1.20 (*)    Glucose, Bld 150 (*)    Calcium, Ion 1.11 (*)    Hemoglobin 11.6 (*)    HCT 34.0 (*)    All other components within normal limits  APTT  ETHANOL  CK  RAPID URINE DRUG SCREEN, HOSP PERFORMED  URINALYSIS, ROUTINE W REFLEX MICROSCOPIC (NOT AT [facility])  I-STAT TROPOININ, ED  CBG MONITORING, ED  I-STAT CHEM 8, ED  I-STAT TROPOININ, ED    EKG  EKG Interpretation  Date/Time:  Wednesday March 19 2016 06:21:31 EST Ventricular Rate:  132 PR Interval:    QRS Duration: 120 QT Interval:  333 QTC Calculation: 494 R Axis:   -41 Text Interpretation:  Atrial fibrillation LVH with IVCD, LAD and secondary repol abnrm Borderline prolonged QT interval Artifact in lead(s) V5 and baseline wander in lead(s) V2 V3 V5 Abnormal ekg No previous ECGs available Confirmed by Bebe ShaggyWICKLINE  MD, DONALD ([id number]) on 03/19/2016 6:26:06 AM       Radiology Dg Chest 1 View  Result Date: 03/19/2016 CLINICAL DATA:  Altered mental status with stroke symptoms. EXAM: CHEST 1 VIEW COMPARISON:  None. FINDINGS: Enlargement of the cardiac silhouette. Lungs are clear without pulmonary edema or focal airspace disease. Negative for a pneumothorax. Trachea is mildly deviated  towards the right. No acute bone abnormality. IMPRESSION: Cardiomegaly without focal lung disease. Electronically Signed   By: Richarda OverlieAdam  Henn M.D.   On: 03/19/2016 07:44   Ct Head Wo Contrast  Result Date: 03/19/2016 CLINICAL DATA:  Found on floor this morning with leftward gaze and aphasia. Concern for stroke. EXAM: CT HEAD WITHOUT CONTRAST CT CERVICAL SPINE WITHOUT CONTRAST TECHNIQUE: Multidetector CT imaging of the head and cervical spine was performed following  the standard protocol without intravenous contrast. Multiplanar CT image reconstructions of the cervical spine were also generated. COMPARISON:  None. FINDINGS: CT HEAD FINDINGS Brain: There is hypoattenuation involving the posterior left insula and cortex and white matter in the left frontoparietal region most consistent with acute moderate-sized MCA territory infarct. The there is prominence of the extra-axial CSF spaces over both frontal lobes which may reflect the atrophy or small subdural hygromas. Overlying the right cerebral hemisphere in the frontal and parietal regions are small foci of extra-axial hemorrhage which are favored to be subarachnoid though a a component of subdural hemorrhage is possible. The ventricles are normal in size for age. There is no evidence of mass or midline shift. Periventricular white matter hypodensities are nonspecific but compatible with chronic small vessel ischemic disease. There is a chronic lacunar infarct in the right caudate head. Vascular: No hyperdense vessel. Calcified atherosclerosis at the skullbase. Skull: No fracture or suspicious osseous lesion. Sinuses/Orbits: Left maxillary sinus mucous retention cyst. Minimal left frontal and left ethmoid sinus mucosal thickening. Visualized mastoid air cells are clear. Unremarkable orbits. Other: None. CT CERVICAL SPINE FINDINGS Alignment: Cervical spine straightening.  No subluxation. Skull base and vertebrae: No acute fracture or destructive osseous lesion.  Incidental incomplete fusion of the C1 posterior ring. Soft tissues and spinal canal: No prevertebral fluid or swelling. No visible canal hematoma. Disc levels: Mild disc space narrowing at C3-4 with mild-to-moderate narrowing from C4-5 to C6-7. Upper chest: Unremarkable. Other: Partially retropharyngeal course of the carotid arteries. IMPRESSION: 1. Moderate-sized acute left MCA infarct. 2. Small volume acute extra-axial hemorrhage over the right cerebral convexity, likely subarachnoid. 3. Enlarged CSF spaces over the frontal lobes, likely secondary to atrophy though small subdural hygromas are also possible. 4. No evidence of acute osseous abnormality in the cervical spine. These results were called by telephone at the time of interpretation on 03/19/2016 at 7:59 am to Dr. Donnald Garre, who verbally acknowledged these results. Electronically Signed   By: Sebastian Ache M.D.   On: 03/19/2016 08:02   Ct Cervical Spine Wo Contrast  Result Date: 03/19/2016 CLINICAL DATA:  Found on floor this morning with leftward gaze and aphasia. Concern for stroke. EXAM: CT HEAD WITHOUT CONTRAST CT CERVICAL SPINE WITHOUT CONTRAST TECHNIQUE: Multidetector CT imaging of the head and cervical spine was performed following the standard protocol without intravenous contrast. Multiplanar CT image reconstructions of the cervical spine were also generated. COMPARISON:  None. FINDINGS: CT HEAD FINDINGS Brain: There is hypoattenuation involving the posterior left insula and cortex and white matter in the left frontoparietal region most consistent with acute moderate-sized MCA territory infarct. The there is prominence of the extra-axial CSF spaces over both frontal lobes which may reflect the atrophy or small subdural hygromas. Overlying the right cerebral hemisphere in the frontal and parietal regions are small foci of extra-axial hemorrhage which are favored to be subarachnoid though a a component of subdural hemorrhage is possible. The  ventricles are normal in size for age. There is no evidence of mass or midline shift. Periventricular white matter hypodensities are nonspecific but compatible with chronic small vessel ischemic disease. There is a chronic lacunar infarct in the right caudate head. Vascular: No hyperdense vessel. Calcified atherosclerosis at the skullbase. Skull: No fracture or suspicious osseous lesion. Sinuses/Orbits: Left maxillary sinus mucous retention cyst. Minimal left frontal and left ethmoid sinus mucosal thickening. Visualized mastoid air cells are clear. Unremarkable orbits. Other: None. CT CERVICAL SPINE FINDINGS Alignment: Cervical spine straightening.  No subluxation. Skull  base and vertebrae: No acute fracture or destructive osseous lesion. Incidental incomplete fusion of the C1 posterior ring. Soft tissues and spinal canal: No prevertebral fluid or swelling. No visible canal hematoma. Disc levels: Mild disc space narrowing at C3-4 with mild-to-moderate narrowing from C4-5 to C6-7. Upper chest: Unremarkable. Other: Partially retropharyngeal course of the carotid arteries. IMPRESSION: 1. Moderate-sized acute left MCA infarct. 2. Small volume acute extra-axial hemorrhage over the right cerebral convexity, likely subarachnoid. 3. Enlarged CSF spaces over the frontal lobes, likely secondary to atrophy though small subdural hygromas are also possible. 4. No evidence of acute osseous abnormality in the cervical spine. These results were called by telephone at the time of interpretation on 03/19/2016 at 7:59 am to Dr. Donnald Garre, who verbally acknowledged these results. Electronically Signed   By: Sebastian Ache M.D.   On: 03/19/2016 08:02   Dg Hip Unilat W Or Wo Pelvis 2-3 Views Right  Result Date: 03/19/2016 CLINICAL DATA:  Stroke. EXAM: DG HIP (WITH OR WITHOUT PELVIS) 2-3V RIGHT COMPARISON:  No recent prior. FINDINGS: Degenerative changes lumbar spine and both hips. Diffuse osteopenia. Surgical sutures in the pelvis.  Pelvic calcifications consistent with phleboliths. IMPRESSION: No acute or focal abnormality. Diffuse osteopenia and degenerative change. Electronically Signed   By: Maisie Fus  Register   On: 03/19/2016 07:42    Procedures Procedures (including critical care time)  Medications Ordered in ED Medications  morphine 4 MG/ML injection 4 mg (4 mg Intravenous Given 03/19/16 0817)  ondansetron (ZOFRAN) injection 4 mg (4 mg Intravenous Given 03/19/16 0815)  sodium chloride 0.9 % bolus 500 mL (500 mLs Intravenous New Bag/Given 03/19/16 0815)  metoprolol (LOPRESSOR) injection 2.5 mg (2.5 mg Intravenous Given 03/19/16 0820)     Initial Impression / Assessment and Plan / ED Course  I have reviewed the triage vital signs and the nursing notes.  Pertinent labs & imaging results that were available during my care of the patient were reviewed by me and considered in my medical decision making (see chart for details).  Clinical Course    Patient seen and examined. Stroke work-up initiated, seizure also on differential. Work-up initiated.  Vital signs reviewed and are as follows: BP (!) 158/113 (BP Location: Left Arm)   Pulse (!) 136   Temp 98.6 F (37 C) (Oral)   Resp 16   Ht 5\' 5"  (1.651 m)   Wt 90.7 kg   SpO2 96%   BMI 33.28 kg/m   Question hip fx on x-ray. Given edema of foot, patient seen at bedside with Dr. Donnald Garre. Pulses found with doppler. Family updated.   Spoke with Dr. Amada Jupiter regarding CT results, he will see.   Hip film neg. CT ordered to confirm. Family updated.   For tachycardia, fluid bolus (CXR clear), lopressor 2.5, and pain control (morphine 4mg ). Will monitor.   9:23 AM Neuro has seen. No intervention will be performed. Will admit to unassigned medicine.   10:09 AM Spoke earlier with IMTS who will see.   Spoke with Dr. Aundria Rud of orthopedic surgery who will consult on swollen ankle.   CRITICAL CARE Performed by: Carolee Rota Total critical care time: 60  minutes Critical care time was exclusive of separately billable procedures and treating other patients. Critical care was necessary to treat or prevent imminent or life-threatening deterioration. Critical care was time spent personally by me on the following activities: development of treatment plan with patient and/or surrogate as well as nursing, discussions with consultants, evaluation of patient's response to treatment, examination  of patient, obtaining history from patient or surrogate, ordering and performing treatments and interventions, ordering and review of laboratory studies, ordering and review of radiographic studies, pulse oximetry and re-evaluation of patient's condition.   Final Clinical Impressions(s) / ED Diagnoses   Final diagnoses:  Acute CVA (cerebrovascular accident) (HCC)  Edema of right foot  Atrial fibrillation with RVR (HCC)   Admit.   New Prescriptions New Prescriptions   No medications on file     Renne CriglerJoshua Doy Taaffe, PA-C 03/19/16 1010    Arby BarretteMarcy Pfeiffer, MD 04/01/16 2012

## 2016-03-20 ENCOUNTER — Encounter (HOSPITAL_COMMUNITY): Payer: Self-pay | Admitting: Radiology

## 2016-03-20 ENCOUNTER — Inpatient Hospital Stay (HOSPITAL_COMMUNITY): Payer: Medicare Other

## 2016-03-20 DIAGNOSIS — S90521D Blister (nonthermal), right ankle, subsequent encounter: Secondary | ICD-10-CM

## 2016-03-20 DIAGNOSIS — I480 Paroxysmal atrial fibrillation: Secondary | ICD-10-CM | POA: Diagnosis present

## 2016-03-20 DIAGNOSIS — I4891 Unspecified atrial fibrillation: Secondary | ICD-10-CM | POA: Diagnosis present

## 2016-03-20 DIAGNOSIS — I63412 Cerebral infarction due to embolism of left middle cerebral artery: Principal | ICD-10-CM

## 2016-03-20 DIAGNOSIS — N183 Chronic kidney disease, stage 3 unspecified: Secondary | ICD-10-CM | POA: Diagnosis present

## 2016-03-20 DIAGNOSIS — W19XXXD Unspecified fall, subsequent encounter: Secondary | ICD-10-CM

## 2016-03-20 DIAGNOSIS — K439 Ventral hernia without obstruction or gangrene: Secondary | ICD-10-CM | POA: Diagnosis present

## 2016-03-20 DIAGNOSIS — I6789 Other cerebrovascular disease: Secondary | ICD-10-CM

## 2016-03-20 DIAGNOSIS — I700 Atherosclerosis of aorta: Secondary | ICD-10-CM

## 2016-03-20 DIAGNOSIS — I69351 Hemiplegia and hemiparesis following cerebral infarction affecting right dominant side: Secondary | ICD-10-CM

## 2016-03-20 DIAGNOSIS — Z79899 Other long term (current) drug therapy: Secondary | ICD-10-CM

## 2016-03-20 DIAGNOSIS — S99921A Unspecified injury of right foot, initial encounter: Secondary | ICD-10-CM | POA: Diagnosis present

## 2016-03-20 DIAGNOSIS — I63512 Cerebral infarction due to unspecified occlusion or stenosis of left middle cerebral artery: Secondary | ICD-10-CM

## 2016-03-20 LAB — LIPID PANEL
Cholesterol: 170 mg/dL (ref 0–200)
HDL: 38 mg/dL — AB (ref >40)
LDL CALC: 111 mg/dL — AB (ref 0–99)
TRIGLYCERIDES: 104 mg/dL (ref <150)
Total CHOL/HDL Ratio: 4.5 RATIO
VLDL: 21 mg/dL (ref 0–40)

## 2016-03-20 LAB — HEMOGLOBIN A1C
Hgb A1c MFr Bld: 5.5 % (ref 4.8–5.6)
Mean Plasma Glucose: 111 mg/dL

## 2016-03-20 LAB — ECHOCARDIOGRAM COMPLETE
Height: 65 in
Weight: 3200 oz

## 2016-03-20 MED ORDER — RESOURCE THICKENUP CLEAR PO POWD
ORAL | Status: DC | PRN
  Filled 2016-03-20: qty 125

## 2016-03-20 MED ORDER — IOPAMIDOL (ISOVUE-370) INJECTION 76%
INTRAVENOUS | Status: AC
  Administered 2016-03-20: 50 mL
  Filled 2016-03-20: qty 50

## 2016-03-20 MED ORDER — STARCH (THICKENING) PO POWD
ORAL | Status: DC | PRN
  Filled 2016-03-20: qty 227

## 2016-03-20 MED ORDER — ATORVASTATIN CALCIUM 40 MG PO TABS
40.0000 mg | ORAL_TABLET | Freq: Every day | ORAL | Status: DC
  Administered 2016-03-20 – 2016-03-25 (×6): 40 mg via ORAL
  Filled 2016-03-20 (×7): qty 1

## 2016-03-20 NOTE — Evaluation (Signed)
Clinical/Bedside Swallow Evaluation Patient Details  Name: Jodi Haney MRN: [id number] Date of Birth: December 14, 1925  Today's Date: 03/20/2016 Time: SLP Start Time (ACUTE ONLY): 0845 SLP Stop Time (ACUTE ONLY): 0858 SLP Time Calculation (min) (ACUTE ONLY): 13 min  Past Medical History:  Past Medical History:  Diagnosis Date  . Atrial fibrillation (HCC)   . Hypertension   . Seizures (HCC)   . Stroke [facility](HCC)    Past Surgical History:  Past Surgical History:  Procedure Laterality Date  . NO PAST SURGERIES     HPI:  [age] yr old visiting from [name] found on the floor by her son with flaccidity on right side and inability to talk. 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 Punctate acute infarction in the right cerebellum an the right occipital lobe. Small amount of subdural or subarachnoid blood on the right probably related to a fall. PMH:  seizures, A-fib.   Assessment / Plan / Recommendation Clinical Impression  Decreased motor response due apraxia, decreased oral acceptance, manipulation and transit with right buccal cavity residue. Delayed swallow initation suspected and indications of decreased laryngeal elevation all concerning for reduced airway protection. Contnue NPO with MBS scheduled for today.     Aspiration Risk  Moderate aspiration risk    Diet Recommendation NPO        Other  Recommendations Oral Care Recommendations: Oral care QID   Follow up Recommendations  (tba)      Frequency and Duration            Prognosis        Swallow Study   General HPI: 82 yr old visiting from [name] found on the floor by her son with flaccidity on right side and inability to talk. 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 Punctate acute infarction in the right cerebellum an the right occipital lobe.  Small amount of subdural or subarachnoid blood on the right probably related to a fall. PMH:  seizures, A-fib. Type of Study: Bedside Swallow Evaluation Previous Swallow Assessment:  (none) Diet Prior to this Study: NPO Temperature Spikes Noted: No Respiratory Status: Nasal cannula History of Recent Intubation: No Behavior/Cognition: Alert;Cooperative;Pleasant mood;Requires cueing Oral Cavity Assessment: Within Functional Limits Oral Care Completed by SLP: Yes Oral Cavity - Dentition:  (upper and lower partial/denture not present) Vision:  (?) Self-Feeding Abilities: Needs assist;Needs set up Patient Positioning: Upright in bed Baseline Vocal Quality:  (non verbal) Volitional Cough:  (apraxic) Volitional Swallow: Unable to elicit    Oral/Motor/Sensory Function Overall Oral Motor/Sensory Function: Moderate impairment Facial ROM: Reduced right;Suspected CN VII (facial) dysfunction Facial Symmetry: Abnormal symmetry right;Suspected CN VII (facial) dysfunction Facial Strength: Reduced right;Suspected CN VII (facial) dysfunction Lingual ROM:  (unable to protrude tongue)   Ice Chips Ice chips: Impaired Presentation: Spoon Oral Phase Impairments: Reduced labial seal;Reduced lingual movement/coordination Oral Phase Functional Implications: Right anterior spillage;Prolonged oral transit Pharyngeal Phase Impairments: Suspected delayed Swallow   Thin Liquid Thin Liquid: Impaired Presentation: Cup;Spoon;Straw Oral Phase Impairments: Reduced labial seal;Reduced lingual movement/coordination Oral Phase Functional Implications: Right anterior spillage;Prolonged oral transit Pharyngeal  Phase Impairments: Suspected delayed Swallow;Decreased hyoid-laryngeal movement    Nectar Thick Nectar Thick Liquid: Not tested   Honey Thick Honey Thick Liquid: Not tested   Puree Puree: Impaired Oral Phase Impairments: Reduced lingual movement/coordination;Reduced labial seal Oral Phase Functional  Implications: Prolonged oral transit;Oral residue  Pharyngeal Phase Impairments: Suspected delayed Swallow;Decreased hyoid-laryngeal movement   Solid   GO   Solid: Not tested        Royce MacadamiaLitaker, Odus Clasby Willis 03/20/2016,9:28 AM   Breck CoonsLisa Willis Lonell FaceLitaker M.Ed [employer] Pager [phone]

## 2016-03-20 NOTE — Progress Notes (Signed)
  Echocardiogram 2D Echocardiogram has been performed.  Arvil ChacoFoster, Jodi Haney 03/20/2016, 4:31 PM

## 2016-03-20 NOTE — Progress Notes (Signed)
Modified Barium Swallow Progress Note  Patient Details  Name: Donalynn FurlongSadie Bryant-Hall MRN: [id number] Date of Birth: [birth year]  Today's Date: 03/20/2016  Modified Barium Swallow completed.  Full report located under Chart Review in the Imaging Section.  Brief recommendations include the following:  Clinical Impression  Decreased lingual cohesion with sublingual residue, intermittent holding and right labial spillage. Pharyngeal phase marked by intermittent delayed swallow initiation and  pharyngeal residue. Audible aspiration with nectar thick via teaspoon and honey thick via teaspoon not penetrated or aspirated. Recommend Dys 1, honey thick via teaspoon ONLY, full supervision, watch for swallow, slow pace and crush meds.    Swallow Evaluation Recommendations       SLP Diet Recommendations: Dysphagia 1 (Puree) solids;Honey thick liquids       Medication Administration: Crushed with puree   Supervision: Full assist for feeding;Full supervision/cueing for compensatory strategies;Staff to assist with self feeding   Compensations: Slow rate;Small sips/bites;Minimize environmental distractions;Lingual sweep for clearance of pocketing;Monitor for anterior loss       Oral Care Recommendations: Oral care BID   Other Recommendations: Order thickener from pharmacy    Royce MacadamiaLitaker, Anavey [place]ombes Willis 03/20/2016,3:26 PM   Breck CoonsLisa Willis Lonell FaceLitaker M.Ed [employer] Pager [phone]

## 2016-03-20 NOTE — Progress Notes (Signed)
Subjective: [name] continues be remain non-verbal with no movement of her right upper extremity (right handed). She not following commands and was slow to wake this morning. She has been incontinent of urine. Her alertness improved throughout the day and she is no longer neglecting the right side of the bed/room. SLP has evaluated her and cleared her for DYS1 diet. PT/OT have yet to assess her this morning but I suspect she will be recommended for 24-assist/SNF given her significant deficits and poor prognosis, unclear how much recovery (if any) she will make. [name] was at bedside and we went over the MRI and discussed these issues and he asked that we return to assist with family meeting at 1PM.  Discussed likey poor prognosis with patients family this afternoon and that she will likely never be independent, verbal, or ambulatory again. Possibly indefinitely SNF/24 hr assist. However she illustrates some improvements in exam since yesterday, may continue to small degree in next few days and with PT, no longer with neglect and can tolerate DYS 1 diet later today. Family find the prospect of SNF acceptable but voice that their mother would likely not like long term artificial measures such as feeding tube/ventilator etc if she should decline further. Interested in discussing these issues with palliative care.  Objective: Vital signs in last 24 hours: Vitals:   03/19/16 2340 03/20/16 0140 03/20/16 0340 03/20/16 0540  BP: 109/76 (!) 120/98 113/89 117/71  Pulse: (!) 120 (!) 117 99 (!) 132  Resp: 18 16 18 18   Temp: 99.1 F (37.3 C) 99.2 F (37.3 C) 99.4 F (37.4 C) 98.2 F (36.8 C)  TempSrc: Oral Oral Axillary Oral  SpO2: 93% 93% 96% 92%  Weight:      Height:        Intake/Output Summary (Last 24 hours) at 03/20/16 16100723 Last data filed at 03/20/16 0434  Gross per 24 hour  Intake          2099.17 ml  Output                0 ml  Net          2099.17 ml    Physical  Exam General appearance: Obese elderly woman, opens eyes to voice, does not follow commands, nonverbal HENT: Normocephalic, atraumatic, PERRL, EOM intact Cardiovascular: Regular rate and rhythm, no murmurs, rubs, gallops Respiratory/Chest: Clear to ausculation bilaterally, normal work of breathing Abdomen: Obese, bowel sounds present, soft, non-tender, non-distended Skin: Warm, dry, intact Extremities: right ankle with bruising and weeping wounds, soft tissue swelling of foot but good distal cap refill, warm Neuro: awake, nonverbal, does not follow commands, no neglect, pupils sluggish, blinks to threat bilaterally, EOM intact spontaneously tracks, unable to assess remaining CNs, flaccid/immobile RUE, LUE 3/5, moves legs spontaneously and retracts to pain except in RUE, DTRs 1+ throughout, abnormal babinski of RLE Psych: Appears calm, otherwise unable to assess  Labs / Imaging / Procedures: CBC Latest Ref Rng & Units 03/19/2016 03/19/2016  WBC 4.0 - 10.5 K/uL - 9.8  Hemoglobin 12.0 - 15.0 g/dL 11.6(L) 10.4(L)  Hematocrit 36.0 - 46.0 % 34.0(L) 31.4(L)  Platelets 150 - 400 K/uL - 241   BMP Latest Ref Rng & Units 03/19/2016 03/19/2016 05/17/2009  Glucose 65 - 99 mg/dL 960(A150(H) 540(J152(H) 811(B117(H)  BUN 6 - 20 mg/dL 14(N31(H) 82(N28(H) 56(O26(H)  Creatinine 0.44 - 1.00 mg/dL 1.30(Q1.20(H) 6.57(Q1.29(H) 4.691.19  Sodium 135 - 145 mmol/L 142 140 139  Potassium 3.5 - 5.1 mmol/L 4.2  4.2 4.3  Chloride 101 - 111 mmol/L 110 109 109  CO2 22 - 32 mmol/L - 20(L) 23  Calcium 8.9 - 10.3 mg/dL - 9.5 9.3   Ct Angio Head W Or Wo Contrast  Result Date: 03/20/2016 CLINICAL DATA:  Found on floor yesterday.  Stroke. EXAM: CT ANGIOGRAPHY HEAD AND NECK TECHNIQUE: Multidetector CT imaging of the head and neck was performed using the standard protocol during bolus administration of intravenous contrast. Multiplanar CT image reconstructions and MIPs were obtained to evaluate the vascular anatomy. Carotid stenosis measurements (when applicable) are obtained  utilizing NASCET criteria, using the distal internal carotid diameter as the denominator. CONTRAST:  80 cc Isovue 370 intravenous COMPARISON:  Yesterday FINDINGS: CT HEAD FINDINGS Brain: Sizable infarct in the posterior division left MCA territory with more well-defined cytotoxic edema. Local mass effect without midline shift. No hemorrhagic conversion. The right superior cerebellar infarct by MRI is not clearly seen by CT. Stable intermittently seen extra-axial hemorrhage around the right cerebral convexity. Stable CSF density prominence around the cerebral convexities, possible hygromas. Vascular: No proximal hyper dense vessel. Skull: Negative for fracture Sinuses: Mucous retention cysts in the left maxillary antrum. Orbits: No acute finding Review of the MIP images confirms the above findings CTA NECK FINDINGS Aortic arch: Atherosclerosis without acute finding.  No ulceration. Right carotid system: Predominant calcified plaque brachiocephalic origin. Carotid branches are smooth and widely patent. No flow limiting stenosis. Left carotid system: Atherosclerotic plaque at the origin. No stenosis, dissection, or ulceration Vertebral arteries: Symmetric vertebral arteries, smooth and widely patent. No proximal subclavian stenosis. Skeleton: No acute or aggressive finding. Usual degenerative changes. Other neck: No incidental mass or adenopathy Upper chest: Mosaic attenuation lungs favoring air trapping given degree of airway thickening and intermittent airway collapse. Review of the MIP images confirms the above findings CTA HEAD FINDINGS Anterior circulation: Symmetric carotid arteries. Left M2 branch occlusion correlating with the acute infarct. No more proximal flow limiting stenosis. No other major branch occlusion. Negative for aneurysm Posterior circulation: Unremarkable vertebrobasilar branching. Mild atherosclerotic luminal irregularity. No major branch occlusion or treatable flow limiting stenosis. Venous  sinuses: Patent Anatomic variants: None significant Delayed phase: No parenchymal enhancement or mass. Review of the MIP images confirms the above findings IMPRESSION: 1. Distal left M2 branch occlusion correlating with acute left MCA territory infarct. No more proximal flow limiting stenosis. 2. Atherosclerosis that is overall mild for age. No stenosis or embolic source seen in the neck. 3. Stable small volume extra-axial hemorrhage around the right cerebral convexity. 4. Heterogeneous lung aeration, likely small airways disease with air trapping. Electronically Signed   By: Marnee SpringJonathon  Watts M.D.   On: 03/20/2016 09:52   Ct Angio Neck W Or Wo Contrast  Result Date: 03/20/2016 CLINICAL DATA:  Found on floor yesterday.  Stroke. EXAM: CT ANGIOGRAPHY HEAD AND NECK TECHNIQUE: Multidetector CT imaging of the head and neck was performed using the standard protocol during bolus administration of intravenous contrast. Multiplanar CT image reconstructions and MIPs were obtained to evaluate the vascular anatomy. Carotid stenosis measurements (when applicable) are obtained utilizing NASCET criteria, using the distal internal carotid diameter as the denominator. CONTRAST:  80 cc Isovue 370 intravenous COMPARISON:  Yesterday FINDINGS: CT HEAD FINDINGS Brain: Sizable infarct in the posterior division left MCA territory with more well-defined cytotoxic edema. Local mass effect without midline shift. No hemorrhagic conversion. The right superior cerebellar infarct by MRI is not clearly seen by CT. Stable intermittently seen extra-axial hemorrhage around the right cerebral  convexity. Stable CSF density prominence around the cerebral convexities, possible hygromas. Vascular: No proximal hyper dense vessel. Skull: Negative for fracture Sinuses: Mucous retention cysts in the left maxillary antrum. Orbits: No acute finding Review of the MIP images confirms the above findings CTA NECK FINDINGS Aortic arch: Atherosclerosis without  acute finding.  No ulceration. Right carotid system: Predominant calcified plaque brachiocephalic origin. Carotid branches are smooth and widely patent. No flow limiting stenosis. Left carotid system: Atherosclerotic plaque at the origin. No stenosis, dissection, or ulceration Vertebral arteries: Symmetric vertebral arteries, smooth and widely patent. No proximal subclavian stenosis. Skeleton: No acute or aggressive finding. Usual degenerative changes. Other neck: No incidental mass or adenopathy Upper chest: Mosaic attenuation lungs favoring air trapping given degree of airway thickening and intermittent airway collapse. Review of the MIP images confirms the above findings CTA HEAD FINDINGS Anterior circulation: Symmetric carotid arteries. Left M2 branch occlusion correlating with the acute infarct. No more proximal flow limiting stenosis. No other major branch occlusion. Negative for aneurysm Posterior circulation: Unremarkable vertebrobasilar branching. Mild atherosclerotic luminal irregularity. No major branch occlusion or treatable flow limiting stenosis. Venous sinuses: Patent Anatomic variants: None significant Delayed phase: No parenchymal enhancement or mass. Review of the MIP images confirms the above findings IMPRESSION: 1. Distal left M2 branch occlusion correlating with acute left MCA territory infarct. No more proximal flow limiting stenosis. 2. Atherosclerosis that is overall mild for age. No stenosis or embolic source seen in the neck. 3. Stable small volume extra-axial hemorrhage around the right cerebral convexity. 4. Heterogeneous lung aeration, likely small airways disease with air trapping. Electronically Signed   By: Marnee Spring M.D.   On: 03/20/2016 09:52    Assessment/Plan: 82 year old woman with HTN, atrial fibrillation on Eliquis, CHF, seizures, TIA presenting after being found on ground by family members found to have multifocal CVA.  Left MCA CVA: She is nonverbal and unable to  follow commands. Decreased movement in R UE and LE. MR/MRA brain 6-7cm region of infarction in the left MCA territory affecting deep insula, frontoparietal and parietal region. Punctate acute infraction in right cerebllum and right occipital lobe. Small amount of small subdural or subarachnoid blood on right. She was seen in consultation by neurology. CTA revealed left M2 branch occlusion, no more-proximal flow limiting stenosis, atherosclerosis that is overall mild for age. No stenosis or embolic source seen in the neck. - Follow neurology recs - Goals of care discussion with family, palliative care consulted - Hemoglobin A1c 5.5 - LDL 111 - start Lipitor 40mg  - Echo - Cautiously restart antiplatelet therapy tomorrow if clear by neurology -PT/OT/SLP  Fall: CT C-spine with no acute abnormality. CT right hip with no acute osseous findings. Right humerus x-ray negative. Right wrist x-ray with no acute findings. Chest x-ray unremarkable. -PT/OT  Right lower extremity wound: Blistering above right ankle. No evidence of cellulitis. Right ankle x-ray with soft tissue swelling with air/gas in the soft tissues laterally. No focal bone findings. She was seen in consultation by orthopedic surgery who drained the blisters. Xeroform to be placed over the wounds afterwards. - BID dressing changes  Atrial fibrillation: CHA2DS2-VASc of at least 7. -Continue home carvedilol -Will need further discussion on anticoagulation.  VTE ppx: SCDs  FEN: DYS 1 diet  Dispo: Anticipated discharge in approximately 2-3 day(s).   LOS: 1 day   Althia Forts, MD 03/20/2016, 7:23 AM Pager: [phone]

## 2016-03-20 NOTE — Evaluation (Signed)
Speech Language Pathology Evaluation Patient Details Name: Jodi Haney MRN: [id number] DOB: 28-Sep-1925 Today's Date: 03/20/2016 Time: [id number] SLP Time Calculation (min) (ACUTE ONLY): 13 min  Problem List:  Patient Active Problem List   Diagnosis Date Noted  . Stroke (HCC) 03/19/2016  . Acute CVA (cerebrovascular accident) [employer](HCC)    Past Medical History:  Past Medical History:  Diagnosis Date  . Atrial fibrillation (HCC)   . Hypertension   . Seizures (HCC)   . Stroke [facility](HCC)    Past Surgical History:  Past Surgical History:  Procedure Laterality Date  . NO PAST SURGERIES     HPI:  82 yr old visiting from [name] found on the floor by her son with flaccidity on right side and inability to talk. 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 Punctate acute infarction in the right cerebellum an the right occipital lobe. Small amount of subdural or subarachnoid blood on the right probably related to a fall. PMH:  seizures, A-fib.   Assessment / Plan / Recommendation Clinical Impression  Pt exhibits global aphasia and currently not stimulable for phonation via verbal/visual/tactile cues. Significant oral and motor apraxia with inability to follow one step commands. No gesturing observed and significant right inattention. Continue ST to maximize communicative abilities.     SLP Assessment  Patient needs continued Speech Lanaguage Pathology Services    Follow Up Recommendations  Inpatient Rehab    Frequency and Duration min 2x/week  2 weeks      SLP Evaluation Cognition  Overall Cognitive Status: Impaired/Different from baseline Arousal/Alertness: Awake/alert Orientation Level:  (no response to y/n ?'s) Attention: Sustained Sustained Attention: Impaired Sustained Attention Impairment: Verbal basic;Functional basic Memory:  (TBA) Awareness: Impaired Awareness Impairment: Emergent  impairment Problem Solving:  (will assess further) Safety/Judgment: Impaired       Comprehension  Auditory Comprehension Overall Auditory Comprehension: Impaired Yes/No Questions: Impaired Basic Biographical Questions: 0-25% accurate Commands: Impaired One Step Basic Commands: 0-24% accurate Interfering Components: Attention Visual Recognition/Discrimination Discrimination: Not tested Reading Comprehension Reading Status: Not tested    Expression Expression Primary Mode of Expression:  (nonverbal) Verbal Expression Overall Verbal Expression: Impaired Initiation: Impaired Automatic Speech:  (no response to singing) Repetition: Impaired Level of Impairment:  (syllable) Naming: Impairment Responsive: 0-25% accurate Confrontation: Impaired Convergent: 0-24% accurate Pragmatics: Impairment Impairments: Eye contact Interfering Components: Attention Written Expression Dominant Hand: Right Written Expression: Not tested   Oral / Motor  Oral Motor/Sensory Function Overall Oral Motor/Sensory Function: Moderate impairment Facial ROM: Reduced right;Suspected CN VII (facial) dysfunction Facial Symmetry: Abnormal symmetry right;Suspected CN VII (facial) dysfunction Facial Strength: Reduced right;Suspected CN VII (facial) dysfunction Lingual ROM:  (unable to protrude tongue) Motor Speech Overall Motor Speech:  (unable to assess) Intelligibility: Unable to assess (comment) Motor Planning:  (will continue to assess)   GO                    Jodi Haney, Jodi Haney 03/20/2016, 9:38 AM   Jodi Haney Jodi Haney M.Ed [employer] Pager [phone]

## 2016-03-20 NOTE — Care Management Note (Signed)
Case Management Note  Patient Details  Name: Jodi FurlongSadie Bryant-Hall MRN: [id number] Date of Birth: 22-May-1925  Subjective/Objective:  Pt admitted with CVA. She is from [name] with her daughter but has been in [place] visiting [place].                  Action/Plan: Awaiting PT/OT recommendations. CM following for d/c needs.   Expected Discharge Date:                  Expected Discharge Plan:     In-House Referral:     Discharge planning Services     Post Acute Care Choice:    Choice offered to:     DME Arranged:    DME Agency:     HH Arranged:    HH Agency:     Status of Service:  In process, will continue to follow  If discussed at Long Length of Stay Meetings, dates discussed:    Additional Comments:  Kermit BaloKelli F Tam Delisle, RN 03/20/2016, 11:36 AM

## 2016-03-20 NOTE — Progress Notes (Signed)
OT Cancellation Note  Patient Details Name: Jodi Haney MRN: [id number] DOB: 10/03/1925   Cancelled Treatment:    Reason Eval/Treat Not Completed: Patient at procedure or test/ unavailable. Will reattempt as schedule permits.  Raynald KempKathryn Ludia Gartland OTR/L Pager: [phone]  03/20/2016, 10:02 AM

## 2016-03-20 NOTE — Progress Notes (Signed)
Palliative Medicine Team consult was received.  I met briefly with 2 of the patient's sons today.  They report plan is to see how patient does over the next couple of days as she just suffered acute CVA. They plan on making decisions together as a family, and her daughter will be coming into town on Saturday. They will call and let me know when she arrives,  and we will plan on setting up a meeting in order to sit down and discuss long-term goals based on her clinical picture at that point in time.  If there are urgent needs or questions please call [phone]. Thank you for consulting out team to assist with this patients care.  Micheline Rough, MD [place] Team [phone]

## 2016-03-20 NOTE — Progress Notes (Signed)
STROKE TEAM PROGRESS NOTE   HISTORY OF PRESENT ILLNESS (per record) Jodi Haney is a 82 y.o. female with hx of Afib, HTN, seizures, who was found on the floor by her family members this morning around 5 AM. Last known normal last night around 10 pm when she was doing well. She is visiting her son from [place]. This morning around 5 AM her family found her on the floor beside her bed nonverbal. Has not been moving her right arm. She usually is independent, lives with her daughter in [place] along with pt's husband who has dementia. Pt's husband was just recently transferred to a Nursing home. 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 Has some difficulty walking due to knee pain but no falls, no assistive device. Does recently get SOB with exertion easily even after walking 10 feet. No formal dx of CHF.   Takes 4x baby aspirin daily. Also takes eliquis for her afib.    LKW: last night around 10 pm.  tpa given?: no, out of window.  Premorbid modified rankin scale: 2   SUBJECTIVE (INTERVAL HISTORY) Her sons are at the bedside.  Overall she feels her condition is unchanged. She still has global aphasia and right hemiplegia. For her age and deficit, regain independence is unlikely. Agree palliative consult.    OBJECTIVE Temp:  [97.6 F (36.4 C)-99.4 F (37.4 C)] 98.8 F (37.1 C) (11/09 2120) Pulse Rate:  [94-138] 138 (11/09 2120) Cardiac Rhythm: Atrial fibrillation (11/09 1900) Resp:  [16-22] 22 (11/09 2120) BP: (109-136)/(68-98) 136/79 (11/09 2120) SpO2:  [92 %-100 %] 100 % (11/09 2120)  CBC:   Recent Labs Lab 03/19/16 0632 03/19/16 0640  WBC 9.8  --   NEUTROABS 8.2*  --   HGB 10.4* 11.6*  HCT 31.4* 34.0*  MCV 89.7  --   PLT 241  --     Basic Metabolic Panel:   Recent Labs Lab 03/19/16 0632 03/19/16 0640  NA 140 142  K 4.2 4.2  CL 109 110  CO2 20*  --   GLUCOSE 152* 150*  BUN 28* 31*  CREATININE 1.29* 1.20*  CALCIUM 9.5  --     Lipid Panel:      Component Value Date/Time   CHOL 170 03/20/2016 0620   TRIG 104 03/20/2016 0620   HDL 38 (L) 03/20/2016 0620   CHOLHDL 4.5 03/20/2016 0620   VLDL 21 03/20/2016 0620   LDLCALC 111 (H) 03/20/2016 0620   HgbA1c:  Lab Results  Component Value Date   HGBA1C 5.5 03/19/2016   Urine Drug Screen:     Component Value Date/Time   LABOPIA POSITIVE (A) 03/19/2016 1018   COCAINSCRNUR NONE DETECTED 03/19/2016 1018   LABBENZ NONE DETECTED 03/19/2016 1018   AMPHETMU NONE DETECTED 03/19/2016 1018   THCU NONE DETECTED 03/19/2016 1018   LABBARB NONE DETECTED 03/19/2016 1018      IMAGING I have personally reviewed the radiological images below and agree with the radiology interpretations.  Ct Head Wo Contrast  03/19/2016 1. Moderate-sized acute left MCA infarct.  2. Small volume acute extra-axial hemorrhage over the right cerebral convexity, likely subarachnoid.  3. Enlarged CSF spaces over the frontal lobes, likely secondary to atrophy though small subdural hygromas are also possible.   Mr Maxine Glenn Head/brain Wo Cm 03/19/2016 6-7 cm region of confluent infarction in the left MCA territory affecting the deep insula, frontoparietal and parietal region.  Mild swelling but no hemorrhage.  Consistent with posterior division MCA branch vessel occlusion.  Punctate acute infarction  in the right cerebellum an the right occipital lobe.  Therefore, concern regarding embolic disease from the heart or ascending aorta.  Small amount of subdural or subarachnoid blood on the right probably related to a fall.  Markedly degraded MRA. Major vessels at the base of the brain show flow.    CTA Head and Neck 03/20/2016 1. Distal left M2 branch occlusion correlating with acute left MCA territory infarct. No more proximal flow limiting stenosis. 2. Atherosclerosis that is overall mild for age. No stenosis or embolic source seen in the neck. 3. Stable small volume extra-axial hemorrhage around the right cerebral  convexity. 4. Heterogeneous lung aeration, likely small airways disease with air trapping.  Dg Chest 1 View 03/19/2016 Cardiomegaly without focal lung disease.   TTE -  Left ventricle: Cannot rule out apical aneurysm Recommend limited   study with definity contrast to evaluate further . The cavity   size was severely dilated. There was moderate focal basal and   mild concentric hypertrophy. Systolic function was mildly to   moderately reduced. The estimated ejection fraction was in the   range of 40% to 45%. - Aortic valve: Trileaflet; mildly thickened, mildly calcified   leaflets. There was mild regurgitation. - Mitral valve: There was moderate regurgitation. - Left atrium: The atrium was severely dilated. - Pulmonary arteries: PA peak pressure: 62 mm Hg (S). Impressions: - The right ventricular systolic pressure was increased consistent   with moderate pulmonary hypertension.  PHYSICAL EXAM Constitutional: awake, non verbal, elderly lady.  Eyes: brusing noted around right eye.  Head: Normocephalic.  Cardiovascular: irregularly irregular heart rate and rhythm.  Respiratory: Effort normal and breath sounds normal to anterior ascultation GI: Soft.  No distension. There is no tenderness.  Ext: has blisters over her right foot and foreleg area. Right feet is cooler to touch and edematous.   Neuro: Mental Status: Patient is awake, nonverbal, global aphasia and does not follow commands. No neglect.  Cranial Nerves: II: pupils are round, equal, but sluggish. Not blinking to visual threat on the right III,IV, VI: EOM seems to be intact as she is looking around on her own but does not follow command to assess fully. V: unable to assess. VII:  unable to assess. VIII:  unable to assess. X:  unable to assess. XI:  unable to assess. XII:  unable to assess. Motor: Unable to assess str. Able to hold up left arm and left leg. Moves right leg to painful stimuli, not able to hold up  against gravity. Does not move right arm.  Sensory: Unable to assess.  Plantars: Toes upgoing on right, down going on left.  Cerebellar: Unable to assess.    ASSESSMENT/PLAN [name] is a 82 y.o. female with history of previous strokes, seizures, hypertension, and atrial fibrillation (on low dose Eliquis PTA) presenting with aphasia and right hemiparesis. She did not receive IV t-PA due to anticoagulation.  Strokes:  Left MCA large infarct with punctate infarcts at right PCA and right SCA consistent with embolic due to afib with under-dosed eliquis.    Resultant  Global aphasia with right hemiplegia  MRI - multiple infarcts as noted above consistent with embolic source. Right cerebral convexity SDH  CTA H&N - Distal left M2 branch occlusion correlating with acute left MCA territory infarct. Stable small volume extra-axial hemorrhage around the right cerebral convexity.  2D Echo - EF 40-45%  LDL - 111  HgbA1c - 5.5  VTE prophylaxis - SCDs DIET - DYS 1  Room service appropriate? Yes; Fluid consistency: Honey Thick  Eliquis (apixaban) daily 2.5mg  bid rior to admission, now on No antithrombotic secondary to SDH. Will hold off eliquis for now, repeat CT in 1 week, if SDH absorbed or stabilized, she can resume eliquis but increase to 5mg  bid.   Ongoing aggressive stroke risk factor management  Therapy recommendations:  SNF recommended  Disposition: Pending  SDH   Small amount  Repeat CT in one week  afib  Persistent  On eliquis PTA but underdosed - only 2.5mg  bid  Will hold off eliquis for now due to ICH and large sized stroke, will repeat CT in 1 week, if SDH absorbed or stabilized, she can resume eliquis but increase to 5mg  bid.   Hypertension  Stable  BP goal < 160 due to SDH  Long-term BP goal normotensive  Hyperlipidemia  Home meds:  No lipid lowering medications prior to admission.  LDL 111, goal < 70  Add Lipitor 40 mg daily  Continue  statin at discharge  Hx of seizure   On keppra 750mg  bid  No seizure so far  Other Stroke Risk Factors  Advanced age  Obesity, Body mass index is 33.28 kg/m., recommend weight loss, diet and exercise as appropriate   Hx stroke/TIA  Family hx stroke (Mother and two brothers)   Other Active Problems  Anemia - 11.6 / 34   Renal insufficiency - 31 / 1.20  For her age and deficit, regain independence is unlikely. Agree with palliative consult.    Hospital day # 1  Marvel PlanJindong Kerly Rigsbee, MD PhD Stroke Neurology 03/20/2016 11:27 PM    To contact Stroke Continuity provider, please refer to [email or web address]. After hours, contact General Neurology

## 2016-03-20 NOTE — Evaluation (Signed)
Physical Therapy Evaluation Patient Details Name: Jodi Haney MRN: [id number] DOB: [birth year] Today's Date: 03/20/2016   History of Present Illness  82 year old woman with HTN, atrial fibrillation on Eliquis, CHF, seizures, TIA presenting after being found on ground by family members. MRI on 11/8 positive for L MCA territory affecting the deep insula, frontoparietal and parital region; punctate acute infarct in the R cerebellum.  Clinical Impression  Patient presents at dependent level for mobility due to deficits listed in PT problem list including R hemiparesis, poor balance, decreased awareness and attention and arousal.  She will benefit from skilled PT in the acute setting to allow d/c to SNF for further rehab.     Follow Up Recommendations SNF;Supervision/Assistance - 24 hour    Equipment Recommendations  Other (comment) (TBA)    Recommendations for Other Services       Precautions / Restrictions Precautions Precautions: Fall Restrictions Weight Bearing Restrictions: No      Mobility  Bed Mobility Overal bed mobility: Needs Assistance;+2 for physical assistance Bed Mobility: Rolling;Sidelying to Sit;Sit to Supine Rolling: Max assist;+2 for physical assistance Sidelying to sit: Total assist;+2 for physical assistance   Sit to supine: Total assist;+2 for physical assistance   General bed mobility comments: Pt initiating rolling onto back during peri care. Otherwise pt total assist +2 for bed mobility.  Transfers Overall transfer level: Needs assistance Equipment used: 2 person hand held assist Transfers: Sit to/from Stand Sit to Stand: Total assist;+2 physical assistance         General transfer comment: Attempted sit to stand from EOB with total assist +2 and use of bed pad with bil knees blocked. Unable to achieve full upright posture.  Ambulation/Gait                Stairs            Wheelchair Mobility    Modified Rankin (Stroke  Patients Only) Modified Rankin (Stroke Patients Only) Pre-Morbid Rankin Score: No symptoms Modified Rankin: Severe disability     Balance Overall balance assessment: Needs assistance Sitting-balance support: Feet supported;Bilateral upper extremity supported Sitting balance-Leahy Scale: Poor Sitting balance - Comments: R lateral lean and intermittetly pushing to R side. Postural control: Right lateral lean Standing balance support: Bilateral upper extremity supported Standing balance-Leahy Scale: Zero                               Pertinent Vitals/Pain Pain Assessment: Faces Faces Pain Scale: Hurts little more Pain Location: grimacing, RLE?. L shoulder? Pain Descriptors / Indicators: Grimacing Pain Intervention(s): Limited activity within patient's tolerance;Monitored during session    Home Living Family/patient expects to be discharged to:: Skilled nursing facility Living Arrangements: Children;Spouse/significant other                    Prior Function Level of Independence: Independent         Comments: Completed BADL and mobility independently.     Hand Dominance   Dominant Hand: Right    Extremity/Trunk Assessment   Upper Extremity Assessment: Defer to OT evaluation RUE Deficits / Details: Flaccid, no active ROM noted. Does not withdraw to noxious stimuli.    RUE Sensation: decreased light touch LUE Deficits / Details: Pt grimacing and pulling away with L shoulder PROM.   Lower Extremity Assessment: RLE deficits/detail;LLE deficits/detail;Difficult to assess due to impaired cognition RLE Deficits / Details: withdraws to touch/stimulation,  LLE Deficits / Details: actively moves  L LE, but not to command  Cervical / Trunk Assessment: Kyphotic  Communication   Communication: Expressive difficulties;Receptive difficulties  Cognition Arousal/Alertness: Awake/alert Behavior During Therapy: Flat affect Overall Cognitive Status:  Impaired/Different from baseline                 General Comments: Pt does not seem to follow commands at this time. Did nod "yes" to are you in pain?     General Comments General comments (skin integrity, edema, etc.): Family [facility]    Exercises     Assessment/Plan    PT Assessment Patient needs continued PT services  PT Problem List Decreased strength;Decreased activity tolerance;Decreased balance;Impaired sensation;Decreased cognition;Decreased skin integrity;Decreased mobility;Decreased safety awareness          PT Treatment Interventions DME instruction;Therapeutic activities;Cognitive remediation;Patient/family education;Balance training;Functional mobility training;Neuromuscular re-education;Wheelchair mobility training;Therapeutic exercise    PT Goals (Current goals can be found in the Care Plan section)  Acute Rehab PT Goals Patient Stated Goal: family would like for pt to d/c to rehab prior to home PT Goal Formulation: With family Time For Goal Achievement: 04/03/16 Potential to Achieve Goals: Fair    Frequency Min 3X/week   Barriers to discharge        Co-evaluation PT/OT/SLP Co-Evaluation/Treatment: Yes Reason for Co-Treatment: Complexity of the patient's impairments (multi-system involvement);For patient/therapist safety;Necessary to address cognition/behavior during functional activity PT goals addressed during session: Mobility/safety with mobility;Balance OT goals addressed during session: ADL's and self-care       End of Session Equipment Utilized During Treatment: Gait belt Activity Tolerance: Patient limited by lethargy;Patient limited by fatigue Patient left: with call bell/phone within reach;in bed Nurse Communication: Mobility status         Time: 1445-1521 PT Time Calculation (min) (ACUTE ONLY): 36 min   Charges:   PT Evaluation $PT Eval High Complexity: 1 Procedure     PT G  CodesElray Haney:        Jodi Haney 03/20/2016, 5:23 PM  Jodi Haney, PT [phone] 03/20/2016

## 2016-03-20 NOTE — Evaluation (Addendum)
Occupational Therapy Evaluation Patient Details Name: Jodi FurlongSadie Haney MRN: [id number] DOB: 1926-04-25 Today's Date: 03/20/2016    History of Present Illness 82 year old woman with HTN, atrial fibrillation on Eliquis, CHF, seizures, TIA presenting after being found on ground by family members. MRI on 11/8 positive for L MCA territory affecting the deep insula, frontoparietal and parital region; punctate acute infarct in the R cerebellum.   Clinical Impression   Pts family reports she was independent with ADL PTA. Currently pt total assist +2 for ADL at bed level and bed mobility. Attempted sit to stand from EOB x1 with total assist +2 but unable to get pt in full upright position. Pt presenting with decreased RUE strength/ROM/sensation, impaired cognition, visual impairment, poor sitting balance impacting her independence and safety with ADL and functional mobility. Recommending SNF for follow up to maximize independence and safety with ADL and functional mobility prior to return home. Pt would benefit from continued skilled OT to address established goals.   Follow Up Recommendations  SNF;Supervision/Assistance - 24 hour    Equipment Recommendations  Other (comment) (TBD at next venue)    Recommendations for Other Services       Precautions / Restrictions Precautions Precautions: Fall Restrictions Weight Bearing Restrictions: No      Mobility Bed Mobility Overal bed mobility: Needs Assistance;+2 for physical assistance Bed Mobility: Rolling;Sidelying to Sit;Sit to Supine Rolling: Max assist;+2 for physical assistance Sidelying to sit: Total assist;+2 for physical assistance   Sit to supine: Total assist;+2 for physical assistance   General bed mobility comments: Pt initiating rolling onto back during peri care. Otherwise pt total assist +2 for bed mobility.  Transfers Overall transfer level: Needs assistance Equipment used: 2 person hand held assist Transfers: Sit  to/from Stand Sit to Stand: Total assist;+2 physical assistance         General transfer comment: Attempted sit to stand from EOB with total assist +2 and use of bed pad with bil knees blocked. Unable to achieve full upright posture.    Balance Overall balance assessment: Needs assistance Sitting-balance support: Feet supported;Bilateral upper extremity supported Sitting balance-Leahy Scale: Poor Sitting balance - Comments: R lateral lean and intermittetly pushing to R side. Postural control: Right lateral lean Standing balance support: Bilateral upper extremity supported Standing balance-Leahy Scale: Zero                              ADL Overall ADL's : Needs assistance/impaired                                       General ADL Comments: Pt currenlty total assist with ADL. Pt with incontinent urine episode during session; total assist to clean at bed level. Positioned RUE in elevation.     Vision Additional Comments: Difficult to assess due to impaired cognition. Pt able to track to midline but not past on R side. Pt with L gaze preference.   Perception     Praxis      Pertinent Vitals/Pain Pain Assessment: Faces Faces Pain Scale: Hurts little more Pain Location: grimacing, RLE?, L shoulder? Pain Descriptors / Indicators: Grimacing Pain Intervention(s): Limited activity within patient's tolerance;Monitored during session     Hand Dominance     Extremity/Trunk Assessment Upper Extremity Assessment Upper Extremity Assessment: LUE deficits/detail;RUE deficits/detail RUE Deficits / Details: Flaccid, no active ROM noted. Does not  withdraw to noxious stimuli.  RUE Sensation: decreased light touch RUE Coordination: decreased fine motor;decreased gross motor LUE Deficits / Details: Pt grimacing and pulling away with L shoulder PROM.   Lower Extremity Assessment Lower Extremity Assessment: Defer to PT evaluation       Communication  Communication Communication: Expressive difficulties;Receptive difficulties   Cognition Arousal/Alertness: Awake/alert Behavior During Therapy: Flat affect Overall Cognitive Status: Impaired/Different from baseline                 General Comments: Pt does not seem to follow commands at this time. Did nod "yes" to are you in pain?    General Comments       Exercises       Shoulder Instructions      Home Living Family/patient expects to be discharged to:: Skilled nursing facility Living Arrangements: Children;Spouse/significant other                                      Prior Functioning/Environment Level of Independence: Independent        Comments: Completed BADL and mobility independently.        OT Problem List: Decreased strength;Decreased range of motion;Decreased activity tolerance;Impaired balance (sitting and/or standing);Impaired vision/perception;Decreased coordination;Decreased cognition;Decreased safety awareness;Decreased knowledge of use of DME or AE;Decreased knowledge of precautions;Impaired sensation;Impaired tone;Obesity;Impaired UE functional use;Pain;Increased edema   OT Treatment/Interventions: Self-care/ADL training;Therapeutic exercise;Neuromuscular education;Energy conservation;DME and/or AE instruction;Therapeutic activities;Cognitive remediation/compensation;Visual/perceptual remediation/compensation;Patient/family education;Balance training    OT Goals(Current goals can be found in the care plan section) Acute Rehab OT Goals Patient Stated Goal: family would like for pt to d/c to rehab prior to home OT Goal Formulation: With family Time For Goal Achievement: 04/03/16 Potential to Achieve Goals: Fair ADL Goals Pt Will Perform Eating: with max assist;sitting Pt Will Perform Grooming: with max assist;sitting Pt Will Transfer to Toilet: with max assist;stand pivot transfer;bedside commode Additional ADL Goal #1: Pt will sit  EOB with min assist for 10 minutes as precursor to ADL. Additional ADL Goal #2: Pt will attend to objects in R visual field 50% of the time with mod verbal cues.  OT Frequency: Min 2X/week   Barriers to D/C:            Co-evaluation PT/OT/SLP Co-Evaluation/Treatment: Yes Reason for Co-Treatment: Complexity of the patient's impairments (multi-system involvement);For patient/therapist safety;Necessary to address cognition/behavior during functional activity   OT goals addressed during session: ADL's and self-care      End of Session Equipment Utilized During Treatment: Gait belt;Oxygen Nurse Communication: Mobility status;Need for lift equipment  Activity Tolerance: Patient tolerated treatment well Patient left: in bed;with call bell/phone within reach;with nursing/sitter in room   Time: 1445-1521 OT Time Calculation (min): 36 min Charges:  OT General Charges $OT Visit: 1 Procedure OT Evaluation $OT Eval Moderate Complexity: 1 Procedure G-Codes:     Gaye AlkenBailey A Valeska Haislip M.S., OTR/L Pager: [phone]  03/20/2016, 3:41 PM

## 2016-03-20 NOTE — Progress Notes (Signed)
Initial Nutrition Assessment  DOCUMENTATION CODES:   Obesity unspecified  INTERVENTION:  Provide Magic cup BID, each supplement provides 290 kcal and 9 grams of protein.  Encourage adequate PO intake.   NUTRITION DIAGNOSIS:   Increased nutrient needs related to chronic illness as evidenced by estimated needs.  GOAL:   Patient will meet greater than or equal to 90% of their needs  MONITOR:   PO intake, Supplement acceptance, Diet advancement, Labs, Weight trends, Skin, I & O's  REASON FOR ASSESSMENT:   Low Braden    ASSESSMENT:   82 year old woman with HTN, atrial fibrillation on Eliquis, CHF, seizures, TIA presenting after being found on ground by family members. MRI on 11/8 positive for L MCA territory affecting the deep insula, frontoparietal and parital region; punctate acute infarct in the R cerebellum.  Pt is currently on a dysphagia 1 diet with honey thick liquids. Pt nonverbal. No family at bedside. RD unable to obtain nutrition history. During time of visit, pt was getting ready with NT to consume lunch tray. RD to order magic cup BID. Pt with no observed significant fat or muscle mass loss.   Labs and medications reviewed.   Diet Order:  DIET - DYS 1 Room service appropriate? Yes; Fluid consistency: Honey Thick  Skin:   (Blister on R foot)  Last BM:  unknown  Height:   Ht Readings from Last 1 Encounters:  03/19/16 5\' 5"  (1.651 m)    Weight:   Wt Readings from Last 1 Encounters:  03/19/16 200 lb (90.7 kg)    Ideal Body Weight:  56.8 kg  BMI:  Body mass index is 33.28 kg/m.  Estimated Nutritional Needs:   Kcal:  1700-1850  Protein:  85-95 grams  Fluid:  1.7 - 1.8 L/day  EDUCATION NEEDS:   No education needs identified at this time  Roslyn SmilingStephanie Avante Carneiro, MS, RD, LDN Pager # [phone] After hours/ weekend pager # [phone]

## 2016-03-21 DIAGNOSIS — D649 Anemia, unspecified: Secondary | ICD-10-CM | POA: Diagnosis present

## 2016-03-21 LAB — BASIC METABOLIC PANEL
Anion gap: 7 (ref 5–15)
BUN: 20 mg/dL (ref 6–20)
CALCIUM: 8.6 mg/dL — AB (ref 8.9–10.3)
CO2: 23 mmol/L (ref 22–32)
CREATININE: 1.01 mg/dL — AB (ref 0.44–1.00)
Chloride: 113 mmol/L — ABNORMAL HIGH (ref 101–111)
GFR, EST AFRICAN AMERICAN: 56 mL/min — AB (ref >60)
GFR, EST NON AFRICAN AMERICAN: 48 mL/min — AB (ref >60)
Glucose, Bld: 93 mg/dL (ref 65–99)
Potassium: 4 mmol/L (ref 3.5–5.1)
SODIUM: 143 mmol/L (ref 135–145)

## 2016-03-21 LAB — CBC
HCT: 25.5 % — ABNORMAL LOW (ref 36.0–46.0)
HEMATOCRIT: 24.4 % — AB (ref 36.0–46.0)
Hemoglobin: 7.8 g/dL — ABNORMAL LOW (ref 12.0–15.0)
Hemoglobin: 8.2 g/dL — ABNORMAL LOW (ref 12.0–15.0)
MCH: 29.4 pg (ref 26.0–34.0)
MCH: 29.4 pg (ref 26.0–34.0)
MCHC: 32 g/dL (ref 30.0–36.0)
MCHC: 32.2 g/dL (ref 30.0–36.0)
MCV: 92.1 fL (ref 78.0–100.0)
MCV: 91.4 fL (ref 78.0–100.0)
PLATELETS: 163 10*3/uL (ref 150–400)
PLATELETS: 168 10*3/uL (ref 150–400)
RBC: 2.65 MIL/uL — ABNORMAL LOW (ref 3.87–5.11)
RBC: 2.79 MIL/uL — AB (ref 3.87–5.11)
RDW: 16.4 % — AB (ref 11.5–15.5)
RDW: 16.3 % — ABNORMAL HIGH (ref 11.5–15.5)
WBC: 7.3 10*3/uL (ref 4.0–10.5)
WBC: 8.1 10*3/uL (ref 4.0–10.5)

## 2016-03-21 MED ORDER — LEVETIRACETAM 750 MG PO TABS
750.0000 mg | ORAL_TABLET | Freq: Two times a day (BID) | ORAL | Status: DC
  Administered 2016-03-21: 750 mg via ORAL
  Filled 2016-03-21: qty 1

## 2016-03-21 NOTE — Progress Notes (Signed)
  Speech Language Pathology Treatment: Dysphagia;Cognitive-Linquistic  Patient Details Name: Jodi FurlongSadie Haney MRN: [id number] DOB: July 27, 1925 Today's Date: 03/21/2016 Time: 0802-0825 SLP Time Calculation (min) (ACUTE ONLY): 23 min  Assessment / Plan / Recommendation Clinical Impression  Skilled treatment session focused on dysphagia and cognition goals. SLP facilitated session by providing skilled observation of pt with breakfast tray for toleration of dysphagia 1 with honey thick liquids. Pt with mild swallow delay but good hyolaryngeal excursion. No overt s/s of aspiration noted with honey thick. Pt consumed 1/2 spoon bolus of puree with decreased lingual coordination and trace residue. Pt tolerated without s/s of aspiration. Small bites and slow rate effective. Pt able to follow simple directions within functional tasks and sustained attention to tasks with Min A verbal cues. Continue current diet and POC.    HPI HPI: 82 yr old visiting from [name] found on the floor by her son with flaccidity on right side and inability to talk. 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 Punctate acute infarction in the right cerebellum an the right occipital lobe. Small amount of subdural or subarachnoid blood on the right probably related to a fall. PMH:  seizures, A-fib.      SLP Plan  Continue with current plan of care     Recommendations  Diet recommendations: Dysphagia 1 (puree);Honey-thick liquid Liquids provided via: Teaspoon Medication Administration: Crushed with puree Supervision: Full supervision/cueing for compensatory strategies;Staff to assist with self feeding Compensations: Slow rate;Small sips/bites;Minimize environmental distractions;Lingual sweep for clearance of pocketing;Monitor for anterior loss Postural Changes and/or Swallow Maneuvers: Seated upright 90 degrees                Oral Care  Recommendations: Oral care QID Follow up Recommendations: Inpatient Rehab Plan: Continue with current plan of care       GO             Jodi Haney, M.S., CCC-SLP Speech-Language Pathologist [phone]   Jodi Haney Jodi SaaOverton 03/21/2016, 8:35 AM

## 2016-03-21 NOTE — Progress Notes (Signed)
Subjective: [name] showing some improvement today, nodding and shaking her head to questions posed by her son in front of the treatment team. Still nonverbal and does not follow commands or mimic gestures. RUE remains flaccid immobile. RLE blistering wound with surrounding ecchymoses appears stable compared to yesterday. Interval development of bruising on right wrist but no areas of ecchymoses over abdomen, flanks, chest. This being a concern since her Hb dropped from 11.6 two days ago to 7.8 today, has been receiving IVF during this dilution should not have produced such a precipitous drop.    Objective: Vital signs in last 24 hours: Vitals:   03/21/16 0855 03/21/16 0900 03/21/16 1347 03/21/16 1829  BP: (!) 104/103 110/87 (!) 139/99 140/90  Pulse:  (!) 101 (!) 102 63  Resp:   20 20  Temp:  98.9 F (37.2 C) 99.1 F (37.3 C) 98.9 F (37.2 C)  TempSrc:  Oral Oral Oral  SpO2:   97% 97%  Weight:      Height:       No intake or output data in the 24 hours ending 03/21/16 1949  Physical Exam General appearance: Obese elderly woman, opens eyes to voice and tracks across midline, does not follow commands, nonverbal HENT: Normocephalic, atraumatic, PERRL, EOM intact Cardiovascular: Tachycardic irregular,  no murmurs, rubs, gallops Respiratory/Chest: Clear to ausculation bilaterally, normal work of breathing Abdomen: Obese, bowel sounds present, soft, non-tender, non-distended Skin: Warm, dry, intact Extremities: right ankle with bruising and weeping wounds, soft tissue swelling of foot but good distal cap refill, right wrist with bruising, warm Neuro: awake, nonverbal, does not follow commands, no neglect, pupils sluggish, EOM intact, unable to assess remaining CNs, flaccid/immobile RUE that does not retract to pain, LUE 3/5, moves legs spontaneously and retracts to pain except in RUE, DTRs 1+ throughout, abnormal babinski of RLE Psych: Appears calm, otherwise unable to  assess  Labs / Imaging / Procedures: CBC Latest Ref Rng & Units 03/21/2016 03/21/2016 03/19/2016  WBC 4.0 - 10.5 K/uL 8.1 7.3 -  Hemoglobin 12.0 - 15.0 g/dL 8.2(L) 7.8(L) 11.6(L)  Hematocrit 36.0 - 46.0 % 25.5(L) 24.4(L) 34.0(L)  Platelets 150 - 400 K/uL 168 163 -   BMP Latest Ref Rng & Units 03/21/2016 03/19/2016 03/19/2016  Glucose 65 - 99 mg/dL 93 409(W150(H) 119(J152(H)  BUN 6 - 20 mg/dL 20 47(W31(H) 29(F28(H)  Creatinine 0.44 - 1.00 mg/dL 6.21(H1.01(H) 0.86(V1.20(H) 7.84(O1.29(H)  Sodium 135 - 145 mmol/L 143 142 140  Potassium 3.5 - 5.1 mmol/L 4.0 4.2 4.2  Chloride 101 - 111 mmol/L 113(H) 110 109  CO2 22 - 32 mmol/L 23 - 20(L)  Calcium 8.9 - 10.3 mg/dL 9.6(E8.6(L) - 9.5   95/06/8409/9/17 - TTE Study Conclusions - Left ventricle: Cannot rule out apical aneurysm Recommend limited   study with definity contrast to evaluate further . The cavity   size was severely dilated. There was moderate focal basal and   mild concentric hypertrophy. Systolic function was mildly to   moderately reduced. The estimated ejection fraction was in the   range of 40% to 45%. - Aortic valve: Trileaflet; mildly thickened, mildly calcified   leaflets. There was mild regurgitation. - Mitral valve: There was moderate regurgitation. - Left atrium: The atrium was severely dilated. - Pulmonary arteries: PA peak pressure: 62 mm Hg (S).  Impressions: - The right ventricular systolic pressure was increased consistent   with moderate pulmonary hypertension.  Assessment/Plan: 82 year old woman with HTN, atrial fibrillation on Eliquis, CHF, seizures, TIA presenting after  being found on ground by family members found to have multifocal CVA.  Left MCA CVA: She is nonverbal and unable to follow commands. Decreased movement in R UE and LE. MR/MRA brain 6-7cm region of infarction in the left MCA territory affecting deep insula, frontoparietal and parietal region. Punctate acute infraction in right cerebllum and right occipital lobe. Small amount of small subdural  or subarachnoid blood on right. She was seen in consultation by neurology. CTA revealed left M2 branch occlusion, no more-proximal flow limiting stenosis, atherosclerosis that is overall mild for age. No stenosis or embolic source seen in the neck. - Family to have goals of care discussion with palliative care - Cautiously restart antiplatelet therapy tomorrow if Hb stable - PT/OT - SNF/ 24/7 assist - SLP - DYS1 diet - Hemoglobin A1c 5.5 - LDL 111 - start Lipitor 40mg  - Echo complete, no visible thrombi - Repeat CT on 11/15 if SDH stable/improved and no new hemorrhage can resume Eliquis 5 BID  Right lower extremity wound: Blistering above right ankle. No evidence of cellulitis. Right ankle x-ray with soft tissue swelling with air/gas in the soft tissues laterally. No focal bone findings. She was seen in consultation by orthopedic surgery who drained the blisters. Xeroform to be placed over the wounds afterwards. - BID dressing changes  Atrial fibrillation: CHA2DS2-VASc of at least 7. -Continue home carvedilol - Repeat CT on 11/15 if SDH stable/improved and no new hemorrhage can resume Eliquis 5 BID  VTE ppx: SCDs  FEN: DYS 1 diet  Dispo: Anticipated discharge in approximately 2-3 day(s).   LOS: 2 days   Jodi FortsAdam Quintrell Baze, MD 03/21/2016, 7:49 PM Pager: [phone]

## 2016-03-21 NOTE — Progress Notes (Signed)
STROKE TEAM PROGRESS NOTE   SUBJECTIVE (INTERVAL HISTORY) Her sons are at the bedside.  Overall she feels her condition is unchanged. She still has global aphasia and right hemiplegia. Primary service planned for palliative care consult.    OBJECTIVE Temp:  [98.7 F (37.1 C)-99.3 F (37.4 C)] 98.7 F (37.1 C) (11/10 0539) Pulse Rate:  [94-138] 126 (11/10 0539) Cardiac Rhythm: Atrial fibrillation (11/09 1900) Resp:  [18-22] 22 (11/10 0539) BP: (112-136)/(56-82) 112/82 (11/10 0539) SpO2:  [99 %-100 %] 100 % (11/10 0539)  CBC:   Recent Labs Lab 03/19/16 0632 03/19/16 0640 03/21/16 0531  WBC 9.8  --  7.3  NEUTROABS 8.2*  --   --   HGB 10.4* 11.6* 7.8*  HCT 31.4* 34.0* 24.4*  MCV 89.7  --  92.1  PLT 241  --  163    Basic Metabolic Panel:   Recent Labs Lab 03/19/16 0632 03/19/16 0640 03/21/16 0531  NA 140 142 143  K 4.2 4.2 4.0  CL 109 110 113*  CO2 20*  --  23  GLUCOSE 152* 150* 93  BUN 28* 31* 20  CREATININE 1.29* 1.20* 1.01*  CALCIUM 9.5  --  8.6*    Lipid Panel:     Component Value Date/Time   CHOL 170 03/20/2016 0620   TRIG 104 03/20/2016 0620   HDL 38 (L) 03/20/2016 0620   CHOLHDL 4.5 03/20/2016 0620   VLDL 21 03/20/2016 0620   LDLCALC 111 (H) 03/20/2016 0620   HgbA1c:  Lab Results  Component Value Date   HGBA1C 5.5 03/19/2016   Urine Drug Screen:     Component Value Date/Time   LABOPIA POSITIVE (A) 03/19/2016 1018   COCAINSCRNUR NONE DETECTED 03/19/2016 1018   LABBENZ NONE DETECTED 03/19/2016 1018   AMPHETMU NONE DETECTED 03/19/2016 1018   THCU NONE DETECTED 03/19/2016 1018   LABBARB NONE DETECTED 03/19/2016 1018      IMAGING I have personally reviewed the radiological images below and agree with the radiology interpretations.  Ct Head Wo Contrast  03/19/2016 1. Moderate-sized acute left MCA infarct.  2. Small volume acute extra-axial hemorrhage over the right cerebral convexity, likely subarachnoid.  3. Enlarged CSF spaces over the  frontal lobes, likely secondary to atrophy though small subdural hygromas are also possible.   Mr Maxine GlennMra Head/brain Wo Cm 03/19/2016 6-7 cm region of confluent infarction in the left MCA territory affecting the deep insula, frontoparietal and parietal region.  Mild swelling but no hemorrhage.  Consistent with posterior division MCA branch vessel occlusion.  Punctate acute infarction in the right cerebellum an the right occipital lobe.  Therefore, concern regarding embolic disease from the heart or ascending aorta.  Small amount of subdural or subarachnoid blood on the right probably related to a fall.  Markedly degraded MRA. Major vessels at the base of the brain show flow.    CTA Head and Neck 03/20/2016 1. Distal left M2 branch occlusion correlating with acute left MCA territory infarct. No more proximal flow limiting stenosis. 2. Atherosclerosis that is overall mild for age. No stenosis or embolic source seen in the neck. 3. Stable small volume extra-axial hemorrhage around the right cerebral convexity. 4. Heterogeneous lung aeration, likely small airways disease with air trapping.  Dg Chest 1 View 03/19/2016 Cardiomegaly without focal lung disease.   TTE -  Left ventricle: Cannot rule out apical aneurysm Recommend limited   study with definity contrast to evaluate further . The cavity   size was severely dilated. There was moderate  focal basal and   mild concentric hypertrophy. Systolic function was mildly to   moderately reduced. The estimated ejection fraction was in the   range of 40% to 45%. - Aortic valve: Trileaflet; mildly thickened, mildly calcified   leaflets. There was mild regurgitation. - Mitral valve: There was moderate regurgitation. - Left atrium: The atrium was severely dilated. - Pulmonary arteries: PA peak pressure: 62 mm Hg (S). Impressions: - The right ventricular systolic pressure was increased consistent   with moderate pulmonary hypertension.  PHYSICAL  EXAM Constitutional: awake, non verbal, elderly lady.  Eyes: brusing noted around right eye.  Head: Normocephalic.  Cardiovascular: irregularly irregular heart rate and rhythm.  Respiratory: Effort normal and breath sounds normal to anterior ascultation GI: Soft.  No distension. There is no tenderness.  Ext: has blisters over her right foot and foreleg area. Right feet is cooler to touch and edematous.   Neuro: Mental Status: Patient is awake, nonverbal, global aphasia and does not follow commands. No neglect.  Cranial Nerves: II: pupils are round, equal, but sluggish. Not blinking to visual threat on the right III,IV, VI: EOM seems to be intact as she is looking around on her own but does not follow command to assess fully. V: unable to assess. VII:  unable to assess. VIII:  unable to assess. X:  unable to assess. XI:  unable to assess. XII:  unable to assess. Motor: Unable to assess str. Able to hold up left arm and left leg. Moves right leg to painful stimuli, not able to hold up against gravity. Does not move right arm.  Sensory: Unable to assess.  Plantars: Toes upgoing on right, down going on left.  Cerebellar: Unable to assess.    ASSESSMENT/PLAN Ms. Elynor Kallenberger is a 82 y.o. female with history of previous strokes, seizures, hypertension, and atrial fibrillation (on low dose Eliquis PTA) presenting with aphasia and right hemiparesis. She did not receive IV t-PA due to anticoagulation.  Strokes:  Left MCA large infarct with punctate infarcts at right PCA and right SCA consistent with embolic due to afib with under-dosed eliquis.    Resultant  Global aphasia with right hemiplegia  MRI - multiple infarcts as noted above consistent with embolic source. Right cerebral convexity SDH  CTA H&N - Distal left M2 branch occlusion correlating with acute left MCA territory infarct. Stable small volume extra-axial hemorrhage around the right cerebral convexity.  2D Echo -  EF 40-45%  LDL - 111  HgbA1c - 5.5  VTE prophylaxis - SCDs DIET - DYS 1 Room service appropriate? Yes; Fluid consistency: Honey Thick  Eliquis (apixaban) daily 2.5mg  bid rior to admission, now on No antithrombotic secondary to SDH. Will hold off eliquis for now, repeat CT in 1 week, if SDH absorbed or stabilized, she can resume eliquis but increase to 5mg  bid.   Ongoing aggressive stroke risk factor management  Therapy recommendations:  SNF recommended  Disposition: Pending  SDH   Small amount  Repeat CT in one week  afib  Persistent  On eliquis PTA but underdosed - only 2.5mg  bid  Will hold off eliquis for now due to ICH and large sized stroke, will repeat CT in 1 week, if SDH absorbed or stabilized, she can resume eliquis but increase to 5mg  bid.   Hypertension  Stable  BP goal < 160 due to SDH  Long-term BP goal normotensive  Hyperlipidemia  Home meds:  No lipid lowering medications prior to admission.  LDL 111, goal < 70  Add Lipitor 40 mg daily  Continue statin at discharge  Hx of seizure   On keppra 750mg  bid  No seizure so far  Other Stroke Risk Factors  Advanced age  Obesity, Body mass index is 33.28 kg/m., recommend weight loss, diet and exercise as appropriate   Hx stroke/TIA  Family hx stroke (Mother and two brothers)   Other Active Problems  Anemia - 11.6 / 34   Renal insufficiency - 31 / 1.20  For her age and deficit, regain independence is unlikely. Agree with palliative consult.    Hospital day # 2  Neurology will sign off. Please call with questions. Pt [facility] weeks. Thanks for the consult.  Marvel PlanJindong Nazaria Riesen, MD PhD Stroke Neurology 03/21/2016 10:07 PM    To contact Stroke Continuity provider, please refer to [email or web address]. After hours, contact General Neurology

## 2016-03-21 NOTE — Clinical Social Work Note (Signed)
Clinical Social Work Assessment  Patient Details  Name: Jodi Haney MRN: [id number] Date of Birth: 07-22-1925  Date of referral:  03/20/2016            Reason for consult:  Facility Placement, Discharge Planning                Permission sought to share information with:  Family Supports Permission granted to share information::  Yes, Verbal Permission Granted  Name::     Caren Griffins  Relationship::  son  Contact Information:  [phone]  Housing/Transportation Living arrangements for the past 2 months:  Apartment Source of Information:   Adult Children Patient Interpreter Needed:   No Criminal Activity/Legal Involvement Pertinent to Current Situation/Hospitalization:  No - Comment as needed Significant Relationships:  Adult Children Lives with:  Adult Children Do you feel safe going back to the place where you live?  No (Pt is in need of a higher level of care at discharge.) Need for family participation in patient care:  Yes (Comment)  Care giving concerns:  Pt needs a higher level of care.   Social Worker assessment / plan:  CSW met with pt's family to address consult for New SNF. PT is recommending SNF. CSW introduced herself and explained role of social work. CSW also explained the process of discarging to SNF with Medicare. CSW also explained the process of obtaining an out of state [place]ARR. Pt is a resident of [place]. Pt was visiting [facility] when she was admitted. CSW also explained the process of applying for Medicaid, as the family is interested in obtaining. CSW referred pt's family to [place] to address further questions. CSW will initiate SNF search and follow up with bed offers. CSW will continue to follow.   Employment status:  Retired Forensic scientist:  [employer] PT Recommendations:  [place] / Referral to community resources:  [place], [place]  Patient/Family's Response to care:  Pt's  family were appreciative of CSW support.   Patient/Family's Understanding of and Emotional Response to Diagnosis, Current Treatment, and Prognosis:  Pt's family is interested in placement and obtaining services for pt.   Emotional Assessment Appearance:  Other (Comment Required Attitude/Demeanor/Rapport:  Other Affect (typically observed):  Other Orientation:   (Pt is non-verbal) Alcohol / Substance use:  Not Applicable Psych involvement (Current and /or in the community):  No (Comment)  Discharge Needs  Concerns to be addressed:  Adjustment to Illness Readmission within the last 30 days:  No Current discharge risk:  Chronically ill, Cognitively Impaired Barriers to Discharge:  [place] ([place])   Darden Dates, LCSW 03/21/2016, 11:50 AM

## 2016-03-21 NOTE — Clinical Social Work Placement (Signed)
   CLINICAL SOCIAL WORK PLACEMENT  NOTE  Date:  03/21/2016  Patient Details  Name: Jodi Haney MRN: [id number] Date of Birth: Dec 22, 1925  Clinical Social Work is seeking post-discharge placement for this patient at the Skilled  Nursing Facility level of care (*CSW will initial, date and re-position this form in  chart as items are completed):  Yes   Patient/family provided with [facility] Clinical Social Work Department's list of facilities offering this level of care within the geographic area requested by the patient (or if unable, by the patient's family).  Yes   Patient/family informed of their freedom to choose among providers that offer the needed level of care, that participate in Medicare, Medicaid or managed care program needed by the patient, have an available bed and are willing to accept the patient.  Yes   Patient/family informed of [facility]'s ownership interest [facility] and [facility], as well as of the fact that they are under no obligation to receive care at these facilities.  PASRR submitted to EDS on 03/21/16     PASRR number received on       Existing PASRR number confirmed on       FL2 transmitted to all facilities in geographic area requested by pt/family on 03/21/16     FL2 transmitted to all facilities within larger geographic area on       Patient informed that his/her managed care company has contracts with or will negotiate with certain facilities, including the following:            Patient/family informed of bed offers received.  Patient chooses bed at       Physician recommends and patient chooses bed at      Patient to be transferred to   on  .  Patient to be transferred to facility by       Patient family notified on   of transfer.  Name of family member notified:        PHYSICIAN       Additional Comment:    _______________________________________________ Dede QuerySarah Emmanuel Ercole, LCSW 03/21/2016, 11:43 AM

## 2016-03-21 NOTE — Progress Notes (Signed)
Internal Medicine Attending:   I saw and examined the patient. I reviewed the resident's note and I agree with the resident's findings and plan as documented in the resident's note. Patient much more interactive today.  Per son has been able to communicate yes and no answers via facial expression and pointing.  He reports some intermittent movement of her RUE.  On our exam she does not appear to be moving the RUE but otherwise is able to move other extremities.  She is tolerating a dysphagia diet.  Family will be coming up from [name] this weekend and meet with palliative care to help establish long term goals.  She will need a repeat CT scan in a few days (1 week after presentation) to determine that there is no hemorrhagic conversion then restart 5mg  Eliquis BID (or alternative).

## 2016-03-21 NOTE — [place] FL2 (Signed)
[place] MEDICAID FL2 LEVEL OF CARE SCREENING TOOL     IDENTIFICATION  Patient Name: Jodi Haney Birthdate: Mar 19, 1926 Sex: female Admission Date (Current Location): 03/19/2016  [place]   Facility and Address:  [facility], [address], [place], [place] [address]      Provider Number: [id number]  Attending Physician Name and Address:  Gust RungErik C Hoffman, DO  Relative Name and Phone Number:       Current Level of Care: Hospital Recommended Level of Care: Skilled Nursing Facility Prior Approval Number:    Date Approved/Denied:   PASRR Number:    Discharge Plan: SNF    Current Diagnoses: Patient Active Problem List   Diagnosis Date Noted  . Ventral hernia without obstruction or gangrene 03/20/2016  . Aortic arch atherosclerosis (HCC) 03/20/2016  . Right foot injury 03/20/2016  . CKD (chronic kidney disease) stage 3, GFR 30-59 ml/min 03/20/2016  . Paroxysmal a-fib (HCC) 03/20/2016  . Atrial fibrillation with RVR (HCC)   . Hemiplegia and hemiparesis following cerebral infarction affecting right dominant side (HCC)   . Left acute arterial ischemic stroke, MCA (middle cerebral artery) (HCC) 03/19/2016  . Stroke (HCC)     Orientation RESPIRATION BLADDER Height & Weight        Normal Incontinent Weight: 200 lb (90.7 kg) Height:  5\' 5"  (165.1 cm)  BEHAVIORAL SYMPTOMS/MOOD NEUROLOGICAL BOWEL NUTRITION STATUS      Incontinent Diet (DYS 1, Honey Thick Liquids, LIQUIDS BY TEASPOON ONLY, CRUSH MEDS, CHECK RIGHT SIDE FOR POCKETING)  AMBULATORY STATUS COMMUNICATION OF NEEDS Skin   Extensive Assist Does not communicate Normal                       Personal Care Assistance Level of Assistance  Bathing, Feeding, Dressing Bathing Assistance: Maximum assistance Feeding assistance: Maximum assistance Dressing Assistance: Maximum assistance     Functional Limitations Info  Speech, Hearing, Sight Sight Info:  Impaired Hearing Info: Impaired Speech Info: Impaired (Non-verbal)    SPECIAL CARE FACTORS FREQUENCY  PT (By licensed PT), OT (By licensed OT), Speech therapy     PT Frequency: 5 OT Frequency: 5     Speech Therapy Frequency: 5      Contractures Contractures Info: Not present    Additional Factors Info  Code Status, Allergies Code Status Info: DNR Allergies Info: No known allergies           Current Medications (03/21/2016):  This is the current hospital active medication list Current Facility-Administered Medications  Medication Dose Route Frequency Provider Last Rate Last Dose  . acetaminophen (TYLENOL) tablet 650 mg  650 mg Oral Q4H PRN Lora PaulaJennifer T Krall, MD       Or  . acetaminophen (TYLENOL) suppository 650 mg  650 mg Rectal Q4H PRN Lora PaulaJennifer T Krall, MD      . atorvastatin (LIPITOR) tablet 40 mg  40 mg Oral q1800 David L Rinehuls, PA-C   40 mg at 03/20/16 1745  . carvedilol (COREG) tablet 3.125 mg  3.125 mg Oral BID WC Lora PaulaJennifer T Krall, MD   3.125 mg at 03/21/16 0958  . food thickener (THICK IT) powder   Oral PRN Gust RungErik C Hoffman, DO      . levETIRAcetam (KEPPRA) tablet 750 mg  750 mg Oral BID Tenna ChildRenee J Ackley, RPH      . RESOURCE THICKENUP CLEAR   Oral PRN Marvel PlanJindong Xu, MD      . senna-docusate (Senokot-S)  tablet 1 tablet  1 tablet Oral QHS PRN Lora PaulaJennifer T Krall, MD         Discharge Medications: Please see discharge summary for a list of discharge medications.  Relevant Imaging Results:  Relevant Lab Results:   Additional Information SSN:  [id number]  Dede QuerySarah Janat Tabbert, LCSW

## 2016-03-22 DIAGNOSIS — Z7901 Long term (current) use of anticoagulants: Secondary | ICD-10-CM

## 2016-03-22 DIAGNOSIS — R400 Somnolence: Secondary | ICD-10-CM

## 2016-03-22 DIAGNOSIS — S90521A Blister (nonthermal), right ankle, initial encounter: Secondary | ICD-10-CM

## 2016-03-22 DIAGNOSIS — X58XXXA Exposure to other specified factors, initial encounter: Secondary | ICD-10-CM

## 2016-03-22 LAB — BASIC METABOLIC PANEL
ANION GAP: 8 (ref 5–15)
BUN: 21 mg/dL — ABNORMAL HIGH (ref 6–20)
CHLORIDE: 113 mmol/L — AB (ref 101–111)
CO2: 22 mmol/L (ref 22–32)
Calcium: 8.9 mg/dL (ref 8.9–10.3)
Creatinine, Ser: 1.05 mg/dL — ABNORMAL HIGH (ref 0.44–1.00)
GFR calc non Af Amer: 46 mL/min — ABNORMAL LOW (ref >60)
GFR, EST AFRICAN AMERICAN: 53 mL/min — AB (ref >60)
GLUCOSE: 115 mg/dL — AB (ref 65–99)
POTASSIUM: 4 mmol/L (ref 3.5–5.1)
Sodium: 143 mmol/L (ref 135–145)

## 2016-03-22 LAB — CBC
HEMATOCRIT: 24.3 % — AB (ref 36.0–46.0)
HEMOGLOBIN: 7.9 g/dL — AB (ref 12.0–15.0)
MCH: 29.7 pg (ref 26.0–34.0)
MCHC: 32.5 g/dL (ref 30.0–36.0)
MCV: 91.4 fL (ref 78.0–100.0)
Platelets: 202 10*3/uL (ref 150–400)
RBC: 2.66 MIL/uL — AB (ref 3.87–5.11)
RDW: 16 % — AB (ref 11.5–15.5)
WBC: 8.5 10*3/uL (ref 4.0–10.5)

## 2016-03-22 MED ORDER — LEVETIRACETAM 100 MG/ML PO SOLN
750.0000 mg | Freq: Two times a day (BID) | ORAL | Status: DC
  Administered 2016-03-22 – 2016-03-25 (×7): 750 mg via ORAL
  Filled 2016-03-22 (×9): qty 7.5

## 2016-03-22 NOTE — Progress Notes (Signed)
CCMD called for 9beat run of Vtach. Patient resting comfortably in bed, back in afib rhythm.  Continue to monitor patient.

## 2016-03-22 NOTE — Progress Notes (Signed)
  Date: 03/22/2016  Patient name: Jodi Haney  Medical record number: [id number]  Date of birth: Aug 30, 1925   This patient's plan of care was discussed with the house staff. Please see their note for complete details. I concur with their findings. Await family meeting today regarding her goals of care.  Her daughter has reviewed the palliative care concept and is encouraged.   Ginnie SmartJeffrey C Dinora Hemm, MD 03/22/2016, 10:54 AM

## 2016-03-22 NOTE — Progress Notes (Signed)
Subjective:   No acute events overnight. Pt seen in the afternoon, was somnolent . Spoken with family earlier in the morning during team rounds and they are planning for a family meeting today   HgB has been stable at 7.9  Objective: Vital signs in last 24 hours: Vitals:   03/21/16 2100 03/22/16 0137 03/22/16 0500 03/22/16 0934  BP: 128/87 136/84 132/76 (!) 146/89  Pulse: 79 84 74 97  Resp: 19 18 18 19   Temp: 99 F (37.2 C) 98.5 F (36.9 C) 98.8 F (37.1 C) 98.6 F (37 C)  TempSrc: Oral Oral Oral Oral  SpO2: 97% 95% 97% 98%  Weight:      Height:       No intake or output data in the 24 hours ending 03/22/16 1231  Physical Exam General appearance: Obese elderly woman, somnolent on exam but opens eyes to commands.  HENT: Normocephalic, atraumatic Cardiovascular: Tachycardic irregular,  no murmurs, rubs, gallops Respiratory/Chest: Clear to ausculation bilaterally, normal work of breathing Abdomen: Obese, bowel sounds present, soft, non-tender, non-distended Neuro: awake, follows commands. Unable to assess CN Psych: Appears calm, otherwise unable to assess  Labs / Imaging / Procedures: CBC Latest Ref Rng & Units 03/22/2016 03/21/2016 03/21/2016  WBC 4.0 - 10.5 K/uL 8.5 8.1 7.3  Hemoglobin 12.0 - 15.0 g/dL 7.9(L) 8.2(L) 7.8(L)  Hematocrit 36.0 - 46.0 % 24.3(L) 25.5(L) 24.4(L)  Platelets 150 - 400 K/uL 202 168 163   BMP Latest Ref Rng & Units 03/22/2016 03/21/2016 03/19/2016  Glucose 65 - 99 mg/dL 161(W115(H) 93 960(A150(H)  BUN 6 - 20 mg/dL 54(U21(H) 20 98(J31(H)  Creatinine 0.44 - 1.00 mg/dL 1.91(Y1.05(H) 7.82(N1.01(H) 5.62(Z1.20(H)  Sodium 135 - 145 mmol/L 143 143 142  Potassium 3.5 - 5.1 mmol/L 4.0 4.0 4.2  Chloride 101 - 111 mmol/L 113(H) 113(H) 110  CO2 22 - 32 mmol/L 22 23 -  Calcium 8.9 - 10.3 mg/dL 8.9 3.0(Q8.6(L) -   65/11/8409/9/17 - TTE Study Conclusions - Left ventricle: Cannot rule out apical aneurysm Recommend limited   study with definity contrast to evaluate further . The cavity   size was  severely dilated. There was moderate focal basal and   mild concentric hypertrophy. Systolic function was mildly to   moderately reduced. The estimated ejection fraction was in the   range of 40% to 45%. - Aortic valve: Trileaflet; mildly thickened, mildly calcified   leaflets. There was mild regurgitation. - Mitral valve: There was moderate regurgitation. - Left atrium: The atrium was severely dilated. - Pulmonary arteries: PA peak pressure: 62 mm Hg (S).  Impressions: - The right ventricular systolic pressure was increased consistent   with moderate pulmonary hypertension.  Assessment/Plan: 82 year old woman with HTN, atrial fibrillation on Eliquis, CHF, seizures, TIA presenting after being found on ground by family members found to have multifocal CVA.  Left MCA CVA: She presented as nonverbal and unable to follow commands. Decreased movement in R UE and LE. MR/MRA brain 6-7cm region of infarction in the left MCA territory. Small amount of small subdural or subarachnoid blood on right. She was seen in consultation by neurology.  She exhibited some improvement in her functional status. Palliative consulted and family meeting beign held today. Her daughter optimistic about palliative care concept.  HgB has been stable at 7.9 after a 3-4 point drop yesterday   - Family to have goals of care discussion with palliative care -holding antiplatelet therapy for now.  -on dysphagia 1 diet - Repeat CT on 11/15 if SDH  stable/improved and no new hemorrhage can resume Eliquis 5 BID  Right lower extremity wound: Blistering above right ankle. No evidence of cellulitis. Right ankle x-ray with soft tissue swelling with air/gas in the soft tissues laterally. No focal bone findings. She was seen in consultation by orthopedic surgery who drained the blisters. Xeroform to be placed over the wounds afterwards. - BID dressing changes  Atrial fibrillation: CHA2DS2-VASc of at least 7. -Continue home  carvedilol - Repeat CT on 11/15 if SDH stable/improved and no new hemorrhage can resume Eliquis 5 BID  VTE ppx: SCDs  FEN: DYS 1 diet  Dispo: Anticipated discharge in approximately 2-3 day(s).   LOS: 3 days   Deneise LeverParth Kerryn Tennant, MD 03/22/2016, 12:31 PM Pager: [phone]

## 2016-03-23 DIAGNOSIS — Z515 Encounter for palliative care: Secondary | ICD-10-CM

## 2016-03-23 DIAGNOSIS — Z7189 Other specified counseling: Secondary | ICD-10-CM

## 2016-03-23 NOTE — Consult Note (Signed)
Consultation Note Date: 03/23/2016   Patient Name: Jodi Haney  DOB: 1925-12-08  MRN: [id number]  Age / Sex: 82 y.o., female  PCP: No primary care provider on file. Referring Physician: Lucious Groves, DO  Reason for Consultation: Establishing goals of care  HPI/Patient Profile: 82 y.o. female  with past medical history of HTN, A. Fib on Eliquis, CHF, seizures, and TIA was admitted on 03/19/2016 after her family found her on the ground and minimally responsive. Work-up on admission revealed a left MCA large infarct, with a small amount of [likley subarachnoid] hemorrhage on the right. She has resultant global aphasia and right hemiplegia.  Clinical Assessment and Goals of Care: Mrs. Kruk has improved since when I first met her, with marked improvement in her level of alertness. Her family feels she is now communicating her needs to them with facial expressions, and has started to move her right leg a little bit. I was not able to elicit these things during my assessment. She did frequently nod her head, but was not consistent and seemed to be following cues from whoever was asking the question.   Today, Romona Curls NP and myself met with three of the patient's children, with one calling in from [place]. We spoke with Jamas Lav (on the phone), and Kennyth Lose. Our conversation centered around their perception of their mother's condition, and touched on their beliefs surrounding what she would want moving forward. As noted above, they believed that she is making incremental progress in terms of her functionality, and she is starting to more effectively communicate her thoughts non-verbally. Kennyth Lose, with whom Mrs. Bryant-Hall lives, verbalized that her mother's greatest fear would be to loose her physical independence, specifically in performing ADLs like bathing, toileting, and dressing. As they  see daily improvement, even in small ways, they were focused on rehab services and what that would entail.  Primary Decision Maker NEXT OF KIN; pt's children cooperatively make decisions together.    SUMMARY OF RECOMMENDATIONS   -Discharge to rehab facility; location as determined by primary team in conjunction with PT/OT recommendations. Given family's goals of maximizing functional independence, she would benefit from a rehab trial. Furthermore, as Mrs. Bryant-Hall is awake, alert, and has adequate oral intake presently, I do not believe she would be a Hospice candidate.  -Ongoing support of family in decision making. I suspect they are struggling to understand the short and long term plan, and clarifying the goals of these decisions. I will follow-up with them tomorrow.  Code Status/Advance Care Planning:  DNR  Additional Recommendations (Limitations, Scope, Preferences):  Full Scope Treatment  Psycho-social/Spiritual:   Desire for further Chaplaincy support:no  Additional Recommendations: Caregiving  Support/Resources  Prognosis:   Unable to determine  Discharge Planning: [place] for rehab with Palliative care service follow-up      Primary Diagnoses: Present on Admission: . Left acute arterial ischemic stroke, MCA (middle cerebral artery) ([place]) . Ventral hernia without obstruction or gangrene . Aortic arch atherosclerosis ([place]) . Right foot injury .  CKD (chronic kidney disease) stage 3, GFR 30-59 ml/min . Paroxysmal a-fib ([place]) . Normocytic anemia   I have reviewed the medical record, interviewed the patient and family, and examined the patient. The following aspects are pertinent.  Past Medical History:  Diagnosis Date  . Atrial fibrillation ([place])   . Hypertension   . Seizures ([place])   . Stroke [facility])    Social History   Social History  . Marital status: Married    Spouse name: N/A  . Number of children: N/A  . Years of education: N/A    Social History Main Topics  . Smoking status: Never Smoker  . Smokeless tobacco: Never Used  . Alcohol use No  . Drug use: No  . Sexual activity: Not Asked   Other Topics Concern  . None   Social History Narrative  . None   History reviewed. No pertinent family history. Scheduled Meds: . atorvastatin  40 mg Oral q1800  . carvedilol  3.125 mg Oral BID WC  . levETIRAcetam  750 mg Oral BID   Continuous Infusions: PRN Meds:.acetaminophen **OR** acetaminophen, food thickener, RESOURCE THICKENUP CLEAR, senna-docusate No Known Allergies   Review of Systems  -Unable to obtain given pt's global aphasia  Physical Exam  Eyes: EOM are normal.  Neck: Normal range of motion.  Pulmonary/Chest: Effort normal.  Musculoskeletal:  Right sided hemiplegia  Neurological: She is alert.  Global aphasia, unable to follow commands  Skin: Skin is warm and dry. There is pallor.  Psychiatric: She has a normal mood and affect. Her behavior is normal.    Vital Signs: BP (!) 113/56 (BP Location: Right Arm)   Pulse 98   Temp 98.8 F (37.1 C) (Oral)   Resp 17   Ht 5' 5"  (1.651 m)   Wt 90.7 kg (200 lb)   SpO2 95%   BMI 33.28 kg/m  Pain Assessment: No/denies pain   Pain Score: 0-No pain   SpO2: SpO2: 95 % O2 Device:SpO2: 95 % O2 Flow Rate: .O2 Flow Rate (L/min): 2 L/min  IO: Intake/output summary: No intake or output data in the 24 hours ending 03/23/16 1453  LBM:   Baseline Weight: Weight: 90.7 kg (200 lb) Most recent weight: Weight: 90.7 kg (200 lb)     Palliative Assessment/Data:  PPS 50%    Time In: 1300 Time Out: 1430 Time Total: 90 minutes Greater than 50%  of this time was spent counseling and coordinating care related to the above assessment and plan.  Signed by: Charlynn Court, NP Palliative Medicine Team Team Phone # [phone] (Nights/Weekends)

## 2016-03-23 NOTE — Progress Notes (Signed)
  Date: 03/23/2016  Patient name: Jodi Haney  Medical record number: [id number]  Date of birth: [birth year]   This patient's plan of care was discussed with the house staff. Please see their note for complete details. I concur with their findings. Pt sitting up in bed.  Family at bedside Await palliative care eval- family meeting.   Ginnie SmartJeffrey C Maryem Shuffler, MD 03/23/2016, 12:49 PM

## 2016-03-23 NOTE — Progress Notes (Signed)
Subjective: [name] is sitting up in bed and alert this morning. No family at bedside currently. She remains nonverbal, not following commands, picking at her blankets. Would not nod/shake head in response to questions for me this morning. Dense RUE weakness remains. Bandaged RLE appears stable.   Objective: Vital signs in last 24 hours: Vitals:   03/22/16 1812 03/22/16 2100 03/23/16 0100 03/23/16 0500  BP: 131/88 127/84 128/86 118/86  Pulse: (!) 118 (!) 104 97 (!) 144  Resp: 19 20 18 18   Temp: 98.8 F (37.1 C) 99 F (37.2 C) 98.7 F (37.1 C) 98.4 F (36.9 C)  TempSrc: Axillary Oral Oral Oral  SpO2: 98% 99% 99% 95%  Weight:      Height:       No intake or output data in the 24 hours ending 03/23/16 0704  Physical Exam General appearance: Obese elderly woman, opens eyes to voice and tracks, nonverbal, does not follow commands, sighs and softly clears throat  HENT: Normocephalic, atraumatic Cardiovascular: Tachycardic irregular,  no murmurs, rubs, gallops Respiratory/Chest: Clear to ausculation bilaterally, normal work of breathing Abdomen: Obese, bowel sounds present, soft, non-tender, non-distended Neuro: awake, does not follow commands. Unable to assess CN, RUE flaccid Psych: Appears calm, otherwise unable to assess  Labs / Imaging / Procedures: CBC Latest Ref Rng & Units 03/22/2016 03/21/2016 03/21/2016  WBC 4.0 - 10.5 K/uL 8.5 8.1 7.3  Hemoglobin 12.0 - 15.0 g/dL 7.9(L) 8.2(L) 7.8(L)  Hematocrit 36.0 - 46.0 % 24.3(L) 25.5(L) 24.4(L)  Platelets 150 - 400 K/uL 202 168 163   BMP Latest Ref Rng & Units 03/22/2016 03/21/2016 03/19/2016  Glucose 65 - 99 mg/dL 161(W115(H) 93 960(A150(H)  BUN 6 - 20 mg/dL 54(U21(H) 20 98(J31(H)  Creatinine 0.44 - 1.00 mg/dL 1.91(Y1.05(H) 7.82(N1.01(H) 5.62(Z1.20(H)  Sodium 135 - 145 mmol/L 143 143 142  Potassium 3.5 - 5.1 mmol/L 4.0 4.0 4.2  Chloride 101 - 111 mmol/L 113(H) 113(H) 110  CO2 22 - 32 mmol/L 22 23 -  Calcium 8.9 - 10.3 mg/dL 8.9 3.0(Q8.6(L) -    Assessment/Plan: 82 year old woman with HTN, atrial fibrillation on Eliquis, CHF, seizures, TIA presenting after being found on ground by family members found to have multifocal CVA.  Left MCA CVA: She presented as nonverbal and unable to follow commands. Decreased movement in R UE and LE. MR/MRA brain 6-7cm region of infarction in the left MCA territory. Small amount of small subdural or subarachnoid blood on right. She was seen in consultation by neurology.  She exhibited some improvement in her functional status. Palliative consulted and family meeting beign held today. Her daughter optimistic about palliative care concept. Hb stable around 8.0  - Family to have goals of care discussion with palliative care, follow up results of this today - holding antiplatelet therapy for now.  -on dysphagia 1 diet - Repeat CT on 11/15 if SDH stable/improved and no new hemorrhage can resume Eliquis 5 BID  Right lower extremity wound: Blistering above right ankle. No evidence of cellulitis. Right ankle x-ray with soft tissue swelling with air/gas in the soft tissues laterally. No focal bone findings. She was seen in consultation by orthopedic surgery who drained the blisters. Xeroform to be placed over the wounds afterwards. - BID dressing changes  Atrial fibrillation: CHA2DS2-VASc of at least 7. -Continue home carvedilol - Repeat CT on 11/15 if SDH stable/improved and no new hemorrhage can resume Eliquis 5 BID  VTE ppx: SCDs  FEN: DYS 1 diet  Dispo: Anticipated discharge in approximately  2-3 day(s).   LOS: 4 days   Althia FortsAdam Sheina Mcleish, MD 03/23/2016, 7:04 AM Pager: [phone]

## 2016-03-23 NOTE — Progress Notes (Signed)
Changed R foot dressing just now. Family says it's worse than when she came into ER after found on floor post CVA.  There are numerous blebs/blisters, some areas  where blisters broke that are brown/red/yellow.  The area is around her entire ankle and on R  foot.  No odor. Charge Nurse is putting in wound consult. Cleansed w/NS, covered w/vaseline gauze then telfa then abd pad, wrapped in kerlix, clean technique, pt tol. Well.

## 2016-03-24 DIAGNOSIS — E785 Hyperlipidemia, unspecified: Secondary | ICD-10-CM

## 2016-03-24 DIAGNOSIS — I5022 Chronic systolic (congestive) heart failure: Secondary | ICD-10-CM | POA: Diagnosis present

## 2016-03-24 LAB — BASIC METABOLIC PANEL
ANION GAP: 8 (ref 5–15)
BUN: 25 mg/dL — ABNORMAL HIGH (ref 6–20)
CALCIUM: 8.8 mg/dL — AB (ref 8.9–10.3)
CO2: 23 mmol/L (ref 22–32)
Chloride: 113 mmol/L — ABNORMAL HIGH (ref 101–111)
Creatinine, Ser: 1.03 mg/dL — ABNORMAL HIGH (ref 0.44–1.00)
GFR, EST AFRICAN AMERICAN: 55 mL/min — AB (ref >60)
GFR, EST NON AFRICAN AMERICAN: 47 mL/min — AB (ref >60)
GLUCOSE: 110 mg/dL — AB (ref 65–99)
Potassium: 3.9 mmol/L (ref 3.5–5.1)
Sodium: 144 mmol/L (ref 135–145)

## 2016-03-24 LAB — CBC
HCT: 26.2 % — ABNORMAL LOW (ref 36.0–46.0)
Hemoglobin: 8.4 g/dL — ABNORMAL LOW (ref 12.0–15.0)
MCH: 29.3 pg (ref 26.0–34.0)
MCHC: 32.1 g/dL (ref 30.0–36.0)
MCV: 91.3 fL (ref 78.0–100.0)
PLATELETS: 190 10*3/uL (ref 150–400)
RBC: 2.87 MIL/uL — ABNORMAL LOW (ref 3.87–5.11)
RDW: 16 % — AB (ref 11.5–15.5)
WBC: 7.5 10*3/uL (ref 4.0–10.5)

## 2016-03-24 MED ORDER — CARVEDILOL 6.25 MG PO TABS
6.2500 mg | ORAL_TABLET | Freq: Two times a day (BID) | ORAL | Status: DC
  Administered 2016-03-24 – 2016-03-25 (×3): 6.25 mg via ORAL
  Filled 2016-03-24 (×3): qty 1

## 2016-03-24 MED ORDER — ASPIRIN 81 MG PO CHEW
81.0000 mg | CHEWABLE_TABLET | Freq: Every day | ORAL | Status: DC
  Administered 2016-03-24 – 2016-03-25 (×2): 81 mg via ORAL
  Filled 2016-03-24 (×2): qty 1

## 2016-03-24 NOTE — Progress Notes (Addendum)
Subjective: Ms. Buist is sitting up in bed this morning, ready for breakfast. Her neurologic exam remains unchanged, nonverbal, does not follow commands or communicate nonverbally, ongoing dense RUE hemiparesis. Family met with palliative care yesterday and plan was made to pursue discharge to rehab facility to focus on maximizing functional independence. CSW reports that this will likely take place tomorrow.   Objective: Vital signs in last 24 hours: Vitals:   03/23/16 2100 03/24/16 0100 03/24/16 0500 03/24/16 0900  BP: 120/70 115/69 122/66 115/69  Pulse: 97 82 86 72  Resp: 20 18 18 16   Temp: 98.3 F (36.8 C) 98.3 F (36.8 C) 98.5 F (36.9 C) 98.6 F (37 C)  TempSrc: Oral Oral Oral Oral  SpO2: 99% 98% 99% 96%  Weight:      Height:        Intake/Output Summary (Last 24 hours) at 03/24/16 1300 Last data filed at 03/24/16 0800  Gross per 24 hour  Intake              240 ml  Output                0 ml  Net              240 ml    Physical Exam General appearance: Obese elderly woman, opens eyes to voice and tracks, nonverbal, does not follow commands, sighs and softly clears throat  HENT: Normocephalic, atraumatic, moist mucous membranes Cardiovascular: Regular rate but irregular rhythm, no murmurs, rubs, gallops Respiratory/Chest: Adequate breath sounds bilaterally, normal work of breathing Abdomen: Obese, bowel sounds present, soft, non-tender, non-distended Extremities: Obese bulk, RLE with ecchymoses of lower leg surrounding blistering wound Neuro: awake, does not follow commands. Unable to assess CN, RUE flaccid, lifts LUE to gravity, spontaneously moved BLEs Psych: Appears calm, otherwise unable to assess  Labs / Imaging / Procedures: CBC Latest Ref Rng & Units 03/24/2016 03/22/2016 03/21/2016  WBC 4.0 - 10.5 K/uL 7.5 8.5 8.1  Hemoglobin 12.0 - 15.0 g/dL 8.4(L) 7.9(L) 8.2(L)  Hematocrit 36.0 - 46.0 % 26.2(L) 24.3(L) 25.5(L)  Platelets 150 - 400 K/uL 190 202 168    BMP Latest Ref Rng & Units 03/24/2016 03/22/2016 03/21/2016  Glucose 65 - 99 mg/dL 110(H) 115(H) 93  BUN 6 - 20 mg/dL 25(H) 21(H) 20  Creatinine 0.44 - 1.00 mg/dL 1.03(H) 1.05(H) 1.01(H)  Sodium 135 - 145 mmol/L 144 143 143  Potassium 3.5 - 5.1 mmol/L 3.9 4.0 4.0  Chloride 101 - 111 mmol/L 113(H) 113(H) 113(H)  CO2 22 - 32 mmol/L 23 22 23   Calcium 8.9 - 10.3 mg/dL 8.8(L) 8.9 8.6(L)   Assessment/Plan: 82 year old woman with HTN, atrial fibrillation on Eliquis, CHF, seizures, TIA presenting after being found on ground by family members found to have multifocal CVA.  Left MCA CVA: She presented as nonverbal and unable to follow commands. Decreased movement in R UE and LE. MR/MRA brain 6-7cm region of infarction in the left MCA territory. Small amount of small subdural or subarachnoid blood on right. She was seen in consultation by neurology.  She exhibited some improvement in her functional status. Palliative consulted and family meeting beign held on 11/122, will pursue SNF/rehab and attempting maximal recovery. Hb stable around 8.0  - Family held goals of care discussion with palliative care on 11/12, plan for discharge to SNF/rehab, likely tomorrow - 5+ days out from CVA, will cautiously start Aspirin 81 mg today for secondary stroke prevention - Repeat CT on 11/22 (2 weeks post-CVA)  to characterize her SDH, if stable/improved and no new hemorrhage can resume Eliquis 5 BID  Right lower extremity wound: Blistering above right ankle. No evidence of cellulitis. Right ankle x-ray with soft tissue swelling with air/gas in the soft tissues laterally. No focal bone findings. She was seen in consultation by orthopedic surgery who drained the blisters. Xeroform to be placed over the wounds afterwards. - Wound care consult place, recommendations pending - BID dressing changes  Atrial fibrillation: CHA2DS2-VASc of at least 7. Some ongoing tachycardia intermittently into 100s - Trial increased  home Carvedilol to 6.25 BID tonight - Repeat CT on 11/22 to eval intracranial hemorrhage, if stable/improved start Eliquis 22m BID  CHF, TTE on 11/9 revealed LVEF 40-45%, could not rule out apical aneurysm, LV dilated with mild hypetrophy, moderate mitral regurg, moderate pulmonary hypertension - Coreg 6.25 BID - Asa 81 QD  HLD -continue home Lipitor  VTE ppx: SCDs  FEN: DYS 1 diet  Dispo: Anticipated discharge in approximately 1-2 day(s).   LOS: 5 days   AAsencion Partridge MD 03/24/2016, 1:00 PM Pager: [phone]

## 2016-03-24 NOTE — Discharge Summary (Signed)
Name: Jodi Haney MRN: [id number] DOB: 1925/05/30 82 y.o. PCP: No primary care provider on file.  Date of Admission: 03/19/2016  6:19 AM Date of Discharge: 03/25/2016 Attending Physician: Lucious Groves, DO  Discharge Diagnosis: 1. Left acute arterial ischemic stroke, MCA  Active Problems:   Left acute arterial ischemic stroke, MCA (middle cerebral artery) (HCC)   Ventral hernia without obstruction or gangrene   Aortic arch atherosclerosis (HCC)   Right foot injury   CKD (chronic kidney disease) stage 3, GFR 30-59 ml/min   Paroxysmal a-fib (HCC)   Atrial fibrillation with RVR (HCC)   Hemiplegia and hemiparesis following cerebral infarction affecting right dominant side (HCC)   Normocytic anemia   Palliative care encounter   Goals of care, counseling/discussion   Chronic systolic CHF (congestive heart failure) ([place])   Discharge Medications:   Medication List    STOP taking these medications   ELIQUIS 2.5 MG Tabs tablet Generic drug:  apixaban     TAKE these medications   amLODipine 5 MG tablet Commonly known as:  NORVASC Take 5 mg by mouth daily.   aspirin 81 MG chewable tablet Chew 1 tablet (81 mg total) by mouth daily. Start taking on:  03/26/2016   atorvastatin 40 MG tablet Commonly known as:  LIPITOR Take 1 tablet (40 mg total) by mouth daily at 6 PM.   carvedilol 3.125 MG tablet Commonly known as:  COREG Take 2 tablets (6.25 mg total) by mouth 2 (two) times daily with a meal. What changed:  how much to take   food thickener Powd Commonly known as:  THICK IT For dysphagia diet   isosorbide mononitrate 30 MG 24 hr tablet Commonly known as:  IMDUR Take 30 mg by mouth daily.   levETIRAcetam 500 MG tablet Commonly known as:  KEPPRA Take 750 mg by mouth 2 (two) times daily.   lisinopril 2.5 MG tablet Commonly known as:  PRINIVIL,ZESTRIL Take 1 tablet (2.5 mg total) by mouth daily. Start taking on:  03/26/2016   magnesium oxide 400 MG  tablet Commonly known as:  MAG-OX Take 400 mg by mouth daily.   REFRESH OP Apply 1 drop to eye daily.   RESOURCE THICKENUP CLEAR Powd Take by mouth as needed   senna-docusate 8.6-50 MG tablet Commonly known as:  Senokot-S Take 1 tablet by mouth at bedtime as needed for mild constipation.       Disposition and follow-up:   JodiYerania Haney was discharged from [facility] in Stable condition.  At the hospital follow up visit please address:  1.  Left MCA stroke - with residual global aphasia and RUE hemiparesis, nonverbal, does not follow commands, questionable intermittent non-verbal communication with family - assess for improvement in these residual symptoms and any new neurologic deficits  Small right subdural vs subarachnoid hematoma - secondary to fall from standing at the time of her stroke, Eliquis and antiplatelet held. Aspirin 81 mg begun on 11/13. Will require repeat CT head by 11/22 to confirm stability or resultion of the SDH/SAH before Eliquis 5 mg BID can be started for Afib anticoagulation  Afib with RVR - ongoing atrial fibrillation rhythm with intermittent tachycardia throughout hospital stay. Previously on Eliquis 2.50m BID but that dose was felt to be inadequate (had this embolic stroke). Once repeat CT head on 11/22 shows stablized or resolved intracranial blood - can restart Eliquis at 555mBID. Increased Coreg to 6.2581mID for improved rate control.   Chronic systolic CHF, found to  have moderately decreased systolic function LVEF 86-76% with moderate pulmonary hypertension. Started on Lisinopril 2.38m (f/up with BMP in 2 weeks) and increased Coreg to 6.26mBID.   Anemia, presented with Hgb 10.4, dropped to approx 8.0 over 2 days secondary to receiving IV fluids/dilution, monitor recovery or workup sources of blood loss  Right leg blistering - unclear etiology, found on presentation, seen by wound care and recommend xeroform bandage and ace wrap  for light compression with daily dressing change, also float foot to reduce pressure.   2.  Labs / imaging needed at time of follow-up: CBC, BMP, CT head on 11/22  3.  Pending labs/ test needing follow-up: None  Follow-up Appointments:  Contact information for follow-up providers    MADennie BibleNP. Schedule an appointment as soon as possible for a visit in 6 week(s).   Specialty:  Family Medicine Why:  stroke f/u Contact information: [id number]          Contact information for after-discharge care    Destination    [facility].   Specialty:  [place] [address] [place] problem list: Active Problems:   Left acute arterial ischemic stroke, MCA (middle cerebral artery) (HCC)   Ventral hernia without obstruction or gangrene   Aortic arch atherosclerosis (HCC)   Right foot injury   CKD (chronic kidney disease) stage 3, GFR 30-59 ml/min   Paroxysmal a-fib (HCC)   Atrial fibrillation with RVR (HCC)   Hemiplegia and hemiparesis following cerebral infarction affecting right dominant side (HCC)   Normocytic anemia   Palliative care encounter   Goals of care, counseling/discussion   Chronic systolic [place] ([place]ive heart [place]) [place]  1. Left acute arterial ischemic stroke, MCA Jodi Haney a [age]ear old woman with HTN, atrial fibrillation on Eliquis, CHF, seizures, TIA, previously-independent at baseline who presented on 11/8 after being found face down on the ground by family members this morning. [place]. her last known normal was 10pm the night prior. She was conscious at the time and appeared to attempt to talk but was nonverbal. MRI revealed 6-7cm region of infarction in the left MCA  territory affecting deep insula, frontoparietal and parietal region. Punctate acute infraction in right cerebllum and right occipital lobe. Small amount of small subdural or subarachnoid blood on right. These intracranial bleeds have required us[place]oreao hold her Eliquis until repeat CT in 2 weeks. She had several trauma scans and x-rays that did not reveal any acute fracture from her fall. She was seen inconsultation by neurology. CTA revealed left M2 branch occlusion, no more-proximal flow limiting stenosis, atherosclerosis that is overall mild for age. No stenosis or embolic source seen in the neck, but the pattern of her infarcts and ongoing atrial fibrillation strongly suggests cardioembolic source for this stroke. She continues to remain globally aphasic, does not follow commands, with RUE flaccid paralysis. She is intermittently nonverbally communicative with family/friends. Her LDL was found to be elevated and she was started on Lipitor. She was evaluated by speech-language-pathology and found to have mild dysphagia requiring puree/honey-thick liquid diet. Physical therapy evaluated and recommended SNF with 24/7 assist. Transthoracic echocardiogram on 11/9 revealed moderately decreased systolic function, LVEF 4099-83%ith moderate  pulmonary hypertension. Her family met with palliative care on 11/12 and decision was made to pursue discharge to rehab facility given the gradual improvements she has begun to display. She was felt safe to start Aspirin 81 mg daily on 11/13 for secondary stroke prevention. Social work secured placement at skilled nursing facility on 11/14 and patient stable for discharge.   Right leg blistering - unclear etiology, found on presentation, x-rays negative, seen by wound care and recommend continued xeroform bandage and ace wrap for light compression with daily dressing change, also float foot to reduce pressure    Discharge Vitals:   BP 116/76 (BP Location: Left Arm)   Pulse (!)  118   Temp 98.2 F (36.8 C) (Oral)   Resp 18   Ht _0  (1.651 m)   Wt 90.7 kg (200 lb)   SpO2 99%   BMI 33.28 kg/m   Pertinent Labs, Studies, and Procedures:  CBC Latest Ref Rng & Units 03/24/2016 03/22/2016 03/21/2016  WBC 4.0 - 10.5 K/uL 7.5 8.5 8.1  Hemoglobin 12.0 - 15.0 g/dL 8.4(L) 7.9(L) 8.2(L)  Hematocrit 36.0 - 46.0 % 26.2(L) 24.3(L) 25.5(L)  Platelets 150 - 400 K/uL 190 202 168   Ct Angio Head W Or Wo Contrast  Result Date: 03/20/2016 CLINICAL DATA:  Found on floor yesterday.  Stroke. EXAM: CT ANGIOGRAPHY HEAD AND NECK TECHNIQUE: Multidetector CT imaging of the head and neck was performed using the standard protocol during bolus administration of intravenous contrast. Multiplanar CT image reconstructions and MIPs were obtained to evaluate the vascular anatomy. Carotid stenosis measurements (when applicable) are obtained utilizing NASCET criteria, using the distal internal carotid diameter as the denominator. CONTRAST:  80 cc Isovue 370 intravenous COMPARISON:  Yesterday FINDINGS: CT HEAD FINDINGS Brain: Sizable infarct in the posterior division left MCA territory with more well-defined cytotoxic edema. Local mass effect without midline shift. No hemorrhagic conversion. The right superior cerebellar infarct by MRI is not clearly seen by CT. Stable intermittently seen extra-axial hemorrhage around the right cerebral convexity. Stable CSF density prominence around the cerebral convexities, possible hygromas. Vascular: No proximal hyper dense vessel. Skull: Negative for fracture Sinuses: Mucous retention cysts in the left maxillary antrum. Orbits: No acute finding Review of the MIP images confirms the above findings CTA NECK FINDINGS Aortic arch: Atherosclerosis without acute finding.  No ulceration. Right carotid system: Predominant calcified plaque brachiocephalic origin. Carotid branches are smooth and widely patent. No flow limiting stenosis. Left carotid system: Atherosclerotic plaque  at the origin. No stenosis, dissection, or ulceration Vertebral arteries: Symmetric vertebral arteries, smooth and widely patent. No proximal subclavian stenosis. Skeleton: No acute or aggressive finding. Usual degenerative changes. Other neck: No incidental mass or adenopathy Upper chest: Mosaic attenuation lungs favoring air trapping given degree of airway thickening and intermittent airway collapse. Review of the MIP images confirms the above findings CTA HEAD FINDINGS Anterior circulation: Symmetric carotid arteries. Left M2 branch occlusion correlating with the acute infarct. No more proximal flow limiting stenosis. No other major branch occlusion. Negative for aneurysm Posterior circulation: Unremarkable vertebrobasilar branching. Mild atherosclerotic luminal irregularity. No major branch occlusion or treatable flow limiting stenosis. Venous sinuses: Patent Anatomic variants: None significant Delayed phase: No parenchymal enhancement or mass. Review of the MIP images confirms the above findings IMPRESSION: 1. Distal left M2 branch occlusion correlating with acute left MCA territory infarct. No more proximal flow limiting stenosis. 2. Atherosclerosis that is overall mild for age. No stenosis or embolic source seen in the neck. 3. Stable  small volume extra-axial hemorrhage around the right cerebral convexity. 4. Heterogeneous lung aeration, likely small airways disease with air trapping. Electronically Signed   By: Monte Fantasia M.D.   On: 03/20/2016 09:52   Dg Chest 1 View  Result Date: 03/19/2016 CLINICAL DATA:  Altered mental status with stroke symptoms. EXAM: CHEST 1 VIEW COMPARISON:  None. FINDINGS: Enlargement of the cardiac silhouette. Lungs are clear without pulmonary edema or focal airspace disease. Negative for a pneumothorax. Trachea is mildly deviated towards the right. No acute bone abnormality. IMPRESSION: Cardiomegaly without focal lung disease. Electronically Signed   By: Markus Daft M.D.    On: 03/19/2016 07:44   Dg Wrist Complete Right  Result Date: 03/19/2016 CLINICAL DATA:  Altered mental status.  Swelling and bruising. EXAM: RIGHT WRIST - COMPLETE 3+ VIEW COMPARISON:  None. FINDINGS: There chronic osteoarthritic changes at the articulation between the navicular and the multangular bones. No evidence of fracture or dislocation. IMPRESSION: Radial side carpal arthritic changes. No acute or traumatic finding. Electronically Signed   By: Nelson Chimes M.D.   On: 03/19/2016 09:18   Dg Ankle Complete Right  Result Date: 03/19/2016 CLINICAL DATA:  Bruising of the foot and ankle with blisters. EXAM: RIGHT ANKLE - COMPLETE 3+ VIEW COMPARISON:  None. FINDINGS: Marked regional soft tissue swelling. Air/ gas in the soft tissues lateral to the ankle region. The bones are osteopenic. No evidence of focal destruction or lysis. IMPRESSION: Soft-tissue swelling with air/ gas in the soft tissues laterally. No focal bone finding. Electronically Signed   By: Nelson Chimes M.D.   On: 03/19/2016 09:17   Ct Head Wo Contrast  Result Date: 03/19/2016 CLINICAL DATA:  Found on floor this morning with leftward gaze and aphasia. Concern for stroke. EXAM: CT HEAD WITHOUT CONTRAST CT CERVICAL SPINE WITHOUT CONTRAST TECHNIQUE: Multidetector CT imaging of the head and cervical spine was performed following the standard protocol without intravenous contrast. Multiplanar CT image reconstructions of the cervical spine were also generated. COMPARISON:  None. FINDINGS: CT HEAD FINDINGS Brain: There is hypoattenuation involving the posterior left insula and cortex and white matter in the left frontoparietal region most consistent with acute moderate-sized MCA territory infarct. The there is prominence of the extra-axial CSF spaces over both frontal lobes which may reflect the atrophy or small subdural hygromas. Overlying the right cerebral hemisphere in the frontal and parietal regions are small foci of extra-axial hemorrhage  which are favored to be subarachnoid though a a component of subdural hemorrhage is possible. The ventricles are normal in size for age. There is no evidence of mass or midline shift. Periventricular white matter hypodensities are nonspecific but compatible with chronic small vessel ischemic disease. There is a chronic lacunar infarct in the right caudate head. Vascular: No hyperdense vessel. Calcified atherosclerosis at the skullbase. Skull: No fracture or suspicious osseous lesion. Sinuses/Orbits: Left maxillary sinus mucous retention cyst. Minimal left frontal and left ethmoid sinus mucosal thickening. Visualized mastoid air cells are clear. Unremarkable orbits. Other: None. CT CERVICAL SPINE FINDINGS Alignment: Cervical spine straightening.  No subluxation. Skull base and vertebrae: No acute fracture or destructive osseous lesion. Incidental incomplete fusion of the C1 posterior ring. Soft tissues and spinal canal: No prevertebral fluid or swelling. No visible canal hematoma. Disc levels: Mild disc space narrowing at C3-4 with mild-to-moderate narrowing from C4-5 to C6-7. Upper chest: Unremarkable. Other: Partially retropharyngeal course of the carotid arteries. IMPRESSION: 1. Moderate-sized acute left MCA infarct. 2. Small volume acute extra-axial hemorrhage over the  right cerebral convexity, likely subarachnoid. 3. Enlarged CSF spaces over the frontal lobes, likely secondary to atrophy though small subdural hygromas are also possible. 4. No evidence of acute osseous abnormality in the cervical spine. These results were called by telephone at the time of interpretation on 03/19/2016 at 7:59 am to Dr. Johnney Killian, who verbally acknowledged these results. Electronically Signed   By: Logan Bores M.D.   On: 03/19/2016 08:02   Ct Angio Neck W Or Wo Contrast  Result Date: 03/20/2016 CLINICAL DATA:  Found on floor yesterday.  Stroke. EXAM: CT ANGIOGRAPHY HEAD AND NECK TECHNIQUE: Multidetector CT imaging of the head  and neck was performed using the standard protocol during bolus administration of intravenous contrast. Multiplanar CT image reconstructions and MIPs were obtained to evaluate the vascular anatomy. Carotid stenosis measurements (when applicable) are obtained utilizing NASCET criteria, using the distal internal carotid diameter as the denominator. CONTRAST:  80 cc Isovue 370 intravenous COMPARISON:  Yesterday FINDINGS: CT HEAD FINDINGS Brain: Sizable infarct in the posterior division left MCA territory with more well-defined cytotoxic edema. Local mass effect without midline shift. No hemorrhagic conversion. The right superior cerebellar infarct by MRI is not clearly seen by CT. Stable intermittently seen extra-axial hemorrhage around the right cerebral convexity. Stable CSF density prominence around the cerebral convexities, possible hygromas. Vascular: No proximal hyper dense vessel. Skull: Negative for fracture Sinuses: Mucous retention cysts in the left maxillary antrum. Orbits: No acute finding Review of the MIP images confirms the above findings CTA NECK FINDINGS Aortic arch: Atherosclerosis without acute finding.  No ulceration. Right carotid system: Predominant calcified plaque brachiocephalic origin. Carotid branches are smooth and widely patent. No flow limiting stenosis. Left carotid system: Atherosclerotic plaque at the origin. No stenosis, dissection, or ulceration Vertebral arteries: Symmetric vertebral arteries, smooth and widely patent. No proximal subclavian stenosis. Skeleton: No acute or aggressive finding. Usual degenerative changes. Other neck: No incidental mass or adenopathy Upper chest: Mosaic attenuation lungs favoring air trapping given degree of airway thickening and intermittent airway collapse. Review of the MIP images confirms the above findings CTA HEAD FINDINGS Anterior circulation: Symmetric carotid arteries. Left M2 branch occlusion correlating with the acute infarct. No more  proximal flow limiting stenosis. No other major branch occlusion. Negative for aneurysm Posterior circulation: Unremarkable vertebrobasilar branching. Mild atherosclerotic luminal irregularity. No major branch occlusion or treatable flow limiting stenosis. Venous sinuses: Patent Anatomic variants: None significant Delayed phase: No parenchymal enhancement or mass. Review of the MIP images confirms the above findings IMPRESSION: 1. Distal left M2 branch occlusion correlating with acute left MCA territory infarct. No more proximal flow limiting stenosis. 2. Atherosclerosis that is overall mild for age. No stenosis or embolic source seen in the neck. 3. Stable small volume extra-axial hemorrhage around the right cerebral convexity. 4. Heterogeneous lung aeration, likely small airways disease with air trapping. Electronically Signed   By: Monte Fantasia M.D.   On: 03/20/2016 09:52   Ct Cervical Spine Wo Contrast  Result Date: 03/19/2016 CLINICAL DATA:  Found on floor this morning with leftward gaze and aphasia. Concern for stroke. EXAM: CT HEAD WITHOUT CONTRAST CT CERVICAL SPINE WITHOUT CONTRAST TECHNIQUE: Multidetector CT imaging of the head and cervical spine was performed following the standard protocol without intravenous contrast. Multiplanar CT image reconstructions of the cervical spine were also generated. COMPARISON:  None. FINDINGS: CT HEAD FINDINGS Brain: There is hypoattenuation involving the posterior left insula and cortex and white matter in the left frontoparietal region most consistent with acute  moderate-sized MCA territory infarct. The there is prominence of the extra-axial CSF spaces over both frontal lobes which may reflect the atrophy or small subdural hygromas. Overlying the right cerebral hemisphere in the frontal and parietal regions are small foci of extra-axial hemorrhage which are favored to be subarachnoid though a a component of subdural hemorrhage is possible. The ventricles are  normal in size for age. There is no evidence of mass or midline shift. Periventricular white matter hypodensities are nonspecific but compatible with chronic small vessel ischemic disease. There is a chronic lacunar infarct in the right caudate head. Vascular: No hyperdense vessel. Calcified atherosclerosis at the skullbase. Skull: No fracture or suspicious osseous lesion. Sinuses/Orbits: Left maxillary sinus mucous retention cyst. Minimal left frontal and left ethmoid sinus mucosal thickening. Visualized mastoid air cells are clear. Unremarkable orbits. Other: None. CT CERVICAL SPINE FINDINGS Alignment: Cervical spine straightening.  No subluxation. Skull base and vertebrae: No acute fracture or destructive osseous lesion. Incidental incomplete fusion of the C1 posterior ring. Soft tissues and spinal canal: No prevertebral fluid or swelling. No visible canal hematoma. Disc levels: Mild disc space narrowing at C3-4 with mild-to-moderate narrowing from C4-5 to C6-7. Upper chest: Unremarkable. Other: Partially retropharyngeal course of the carotid arteries. IMPRESSION: 1. Moderate-sized acute left MCA infarct. 2. Small volume acute extra-axial hemorrhage over the right cerebral convexity, likely subarachnoid. 3. Enlarged CSF spaces over the frontal lobes, likely secondary to atrophy though small subdural hygromas are also possible. 4. No evidence of acute osseous abnormality in the cervical spine. These results were called by telephone at the time of interpretation on 03/19/2016 at 7:59 am to Dr. Johnney Killian, who verbally acknowledged these results. Electronically Signed   By: Logan Bores M.D.   On: 03/19/2016 08:02   Mr Brain Wo Contrast  Result Date: 03/19/2016 CLINICAL DATA:  Found unresponsive on the floor this morning with right-sided paralysis and aphasia. EXAM: MRI HEAD WITHOUT CONTRAST MRA HEAD WITHOUT CONTRAST TECHNIQUE: Multiplanar, multiecho pulse sequences of the brain and surrounding structures were  obtained without intravenous contrast. Angiographic images of the head were obtained using MRA technique without contrast. COMPARISON:  Head CT same day FINDINGS: MRI HEAD FINDINGS Brain: Punctate acute infarction in the right superior cerebellum. Punctate acute infarction in the right occipital lobe. Confluent acute infarction affecting a the 6-7 cm region of the left cerebral hemisphere in the MCA territory affecting the deep insula, frontoparietal and parietal region. Mild swelling but no hemorrhage or shift at this time. Small amount of acute subarachnoid or subdural hemorrhage along the convexity on the right, presumably secondary to a fall. Background pattern of mild to moderate chronic small-vessel change within the white matter. Vascular: Major vessels at the base of the brain show flow. See below. Skull and upper cervical spine: Negative Sinuses/Orbits: Left maxillary sinus retention cyst. No inflammatory disease. Other: None significant MRA HEAD FINDINGS Markedly degraded by motion. Antegrade flow present within the internal carotid artery's through the skullbase. Flow is seen in the proximal anterior and middle cerebral vessels bilaterally. Both vertebral arteries show flow to the basilar. Flow is present in both posterior cerebral arteries. IMPRESSION: 6-7 cm region of confluent infarction in the left MCA territory affecting the deep insula, frontoparietal and parietal region. Mild swelling but no hemorrhage. Consistent with posterior division MCA branch vessel occlusion. Punctate acute infarction in the right cerebellum an the right occipital lobe. Therefore, concern regarding embolic disease from the heart or ascending aorta. Small amount of subdural or subarachnoid blood on  the right probably related to a fall. Markedly degraded MRA. Major vessels at the base of the brain show flow. Electronically Signed   By: Nelson Chimes M.D.   On: 03/19/2016 12:25   Ct Hip Right Wo Contrast  Result Date:  03/19/2016 CLINICAL DATA:  Right hip pain status post fall today. EXAM: CT OF THE RIGHT HIP WITHOUT CONTRAST TECHNIQUE: Multidetector CT imaging of the right hip was performed according to the standard protocol. Multiplanar CT image reconstructions were also generated. COMPARISON:  Radiographs same date. FINDINGS: Examination is limited to the right hip and inferior right hemipelvis. Bones/Joint/Cartilage The mineralization and alignment are normal. There is no evidence of acute fracture, dislocation or femoral head avascular necrosis. No significant hip arthropathy for age. Mild degenerative changes are present within the right sacroiliac joint and visualized lower lumbar spine. Ligaments Not relevant for exam/indication. Muscles and Tendons Unremarkable. Soft tissues No evidence of periarticular hematoma or other fluid collection. There is mild atherosclerosis of the right iliac and femoral vasculature. There is a large ventral hernia containing colon, incompletely visualized. No signs of obstruction by this examination. IMPRESSION: 1. No acute osseous findings demonstrated at the right hip. 2. Underlying mild degenerative changes within the visualized lower lumbar spine and right sacroiliac joint. 3. Large ventral hernia containing colon, incompletely visualized. Electronically Signed   By: Richardean Sale M.D.   On: 03/19/2016 09:57   Dg Humerus Right  Result Date: 03/19/2016 CLINICAL DATA:  Bruising.  No laceration. EXAM: RIGHT HUMERUS - 2+ VIEW COMPARISON:  None. FINDINGS: There is no evidence of fracture or other focal bone lesions. Soft tissues are unremarkable. IMPRESSION: Negative. Electronically Signed   By: Nelson Chimes M.D.   On: 03/19/2016 09:19   Mr Jodene Nam Head/brain UX Cm  Result Date: 03/19/2016 CLINICAL DATA:  Found unresponsive on the floor this morning with right-sided paralysis and aphasia. EXAM: MRI HEAD WITHOUT CONTRAST MRA HEAD WITHOUT CONTRAST TECHNIQUE: Multiplanar, multiecho pulse  sequences of the brain and surrounding structures were obtained without intravenous contrast. Angiographic images of the head were obtained using MRA technique without contrast. COMPARISON:  Head CT same day FINDINGS: MRI HEAD FINDINGS Brain: Punctate acute infarction in the right superior cerebellum. Punctate acute infarction in the right occipital lobe. Confluent acute infarction affecting a the 6-7 cm region of the left cerebral hemisphere in the MCA territory affecting the deep insula, frontoparietal and parietal region. Mild swelling but no hemorrhage or shift at this time. Small amount of acute subarachnoid or subdural hemorrhage along the convexity on the right, presumably secondary to a fall. Background pattern of mild to moderate chronic small-vessel change within the white matter. Vascular: Major vessels at the base of the brain show flow. See below. Skull and upper cervical spine: Negative Sinuses/Orbits: Left maxillary sinus retention cyst. No inflammatory disease. Other: None significant MRA HEAD FINDINGS Markedly degraded by motion. Antegrade flow present within the internal carotid artery's through the skullbase. Flow is seen in the proximal anterior and middle cerebral vessels bilaterally. Both vertebral arteries show flow to the basilar. Flow is present in both posterior cerebral arteries. IMPRESSION: 6-7 cm region of confluent infarction in the left MCA territory affecting the deep insula, frontoparietal and parietal region. Mild swelling but no hemorrhage. Consistent with posterior division MCA branch vessel occlusion. Punctate acute infarction in the right cerebellum an the right occipital lobe. Therefore, concern regarding embolic disease from the heart or ascending aorta. Small amount of subdural or subarachnoid blood on the right probably  related to a fall. Markedly degraded MRA. Major vessels at the base of the brain show flow. Electronically Signed   By: Nelson Chimes M.D.   On: 03/19/2016  12:25   Dg Hip Unilat W Or Wo Pelvis 2-3 Views Right  Result Date: 03/19/2016 CLINICAL DATA:  Stroke. EXAM: DG HIP (WITH OR WITHOUT PELVIS) 2-3V RIGHT COMPARISON:  No recent prior. FINDINGS: Degenerative changes lumbar spine and both hips. Diffuse osteopenia. Surgical sutures in the pelvis. Pelvic calcifications consistent with phleboliths. IMPRESSION: No acute or focal abnormality. Diffuse osteopenia and degenerative change. Electronically Signed   By: Marcello Moores  Register   On: 03/19/2016 07:42   TTE -03/20/2016 Study Conclusions - Left ventricle: Cannot rule out apical aneurysm Recommend limited   study with definity contrast to evaluate further . The cavity   size was severely dilated. There was moderate focal basal and   mild concentric hypertrophy. Systolic function was mildly to   moderately reduced. The estimated ejection fraction was in the   range of 40% to 45%. - Aortic valve: Trileaflet; mildly thickened, mildly calcified   leaflets. There was mild regurgitation. - Mitral valve: There was moderate regurgitation. - Left atrium: The atrium was severely dilated. - Pulmonary arteries: PA peak pressure: 62 mm Hg (S).  Impressions:  - The right ventricular systolic pressure was increased consistent   with moderate pulmonary hypertension.  Discharge Instructions: Discharge Instructions    Call MD for:  difficulty breathing, headache or visual disturbances    Complete by:  As directed    Call MD for:  extreme fatigue    Complete by:  As directed    Call MD for:  persistant dizziness or light-headedness    Complete by:  As directed    Diet - low sodium heart healthy    Complete by:  As directed    Discharge instructions    Complete by:  As directed    Please continue to take all medications as prescribed and work with all therapy services during your stay at your skilled nursing facility. We have scheduled a follow up CT scan for 11/22 to look for resolution of your brain  bleeding. If this has stabilized we can resume Eliquis 38m twice daily.   Please schedule an appointment with a primary care doctor for hospital follow up as well as with neurology to follow up stroke symptoms and results of your imaging.   Increase activity slowly    Complete by:  As directed       Signed: AAsencion Partridge MD 03/25/2016, 1:33 PM   Pager: [phone]

## 2016-03-24 NOTE — Consult Note (Addendum)
WOC Nurse wound consult note Reason for Consult: Consult requested for right foot wounds.  Ortho consult was performed; refer to previous progress notes and they recommended xeroform and an ace wrap for light compression. Wound type: Multiple areas of previous blisters have ruptured and evolved into partial thickness skin loss to anterior and posterior foot, ankle and heel areas.  Generalized ecchymosis and dark purple bruising to RLE and edema. Measurement:14X5cm area to posterior foot, 8X4 cm to anterior foot, and 1X1.5cm to the side. Wound bed: dark red wound beds where skin has peeled, other areas with skin approximated over ruptured blisters. Drainage (amount, consistency, odor) Mod amt yellow drainage, no odor Dressing procedure/placement/frequency: Float foot to reduce pressure, continue present plan of care as ordered by ortho service.  No family present to discuss plan of care. Please re-consult if further assistance is needed.  Thank-you,  Cammie Mcgeeawn Laiya Wisby MSN, RN, CWOCN, [place], CNS [phone]

## 2016-03-24 NOTE — Progress Notes (Addendum)
Daily Progress Note   Patient Name: Jodi Haney       Date: 03/24/2016 DOB: May 31, 1925  Age: 82 y.o. MRN#: [id number] Attending Physician: Gust RungErik C Hoffman, DO Primary Care Physician: No primary care provider on file. Admit Date: 03/19/2016  Reason for Consultation/Follow-up: Disposition  Subjective: Jodi Haney was assessed at her bedside after working with physical therapy. There was no family present. She continues to demonstrate global aphasia, and was not consistent in her head nods in response to my questions. She was resting comfortably on my arrival, with no behavioral signs of discomfort. She did grimace with gentle movement of her RLE, which has become progressively bruised and swollen since yesterday, but otherwise continued to appear comfortable throughout my exam.   Length of Stay: 5  Current Medications: Scheduled Meds:  . aspirin  81 mg Oral Daily  . atorvastatin  40 mg Oral q1800  . carvedilol  6.25 mg Oral BID WC  . levETIRAcetam  750 mg Oral BID    Continuous Infusions:   PRN Meds: acetaminophen **OR** acetaminophen, food thickener, RESOURCE THICKENUP CLEAR, senna-docusate  Physical Exam  Constitutional: She appears well-developed and well-nourished.  Eyes: EOM are normal.  Neck: Normal range of motion.  Pulmonary/Chest:  Increased WOB and respiratory rate with movement, but unlabored at rest.   Abdominal: Soft. There is no tenderness. There is no guarding.  Musculoskeletal:  Right sided hemiplegia  Neurological: She is alert.  Global aphasia, unable to understand/follow verbal commands. Does mirror actions well. Slight right sided facial droop.  Skin: Skin is warm and dry. There is pallor.  Marked bruising and edema of RLE from foot to mid-calf. Dressing not removed, however skin broken and bleeding  when dressing lifted.   Psychiatric: She has a normal mood and affect. Her behavior is normal. She is noncommunicative.            Vital Signs: BP (!) 131/53 (BP Location: Right Arm)   Pulse (!) 120   Temp 98.1 F (36.7 C) (Oral)   Resp 16   Ht 5\' 5"  (1.651 m)   Wt 90.7 kg (200 lb)   SpO2 98%   BMI 33.28 kg/m  SpO2: SpO2: 98 % O2 Device: O2 Device: Not Delivered O2 Flow Rate: O2 Flow Rate (L/min): 2 L/min  Intake/output summary:  Intake/Output Summary (Last 24 hours) at 03/24/16 1414 Last data filed at 03/24/16 0800  Gross per 24 hour  Intake              240 ml  Output                0 ml  Net              240 ml   LBM:   Baseline Weight: Weight: 90.7 kg (200 lb) Most recent weight: Weight: 90.7 kg (200 lb)       Palliative Assessment/Data:  PPS 40%     Patient Active Problem List   Diagnosis Date Noted  . Chronic systolic CHF (congestive heart failure) (HCC) 03/24/2016  . Palliative care encounter   . Goals of care, counseling/discussion   . Normocytic anemia 03/21/2016  . Ventral hernia without obstruction or gangrene  03/20/2016  . Aortic arch atherosclerosis (HCC) 03/20/2016  . Right foot injury 03/20/2016  . CKD (chronic kidney disease) stage 3, GFR 30-59 ml/min 03/20/2016  . Paroxysmal a-fib (HCC) 03/20/2016  . Atrial fibrillation with RVR (HCC)   . Hemiplegia and hemiparesis following cerebral infarction affecting right dominant side (HCC)   . Left acute arterial ischemic stroke, MCA (middle cerebral artery) (HCC) 03/19/2016    Palliative Care Assessment & Plan   HPI: 82 y.o. female  with past medical history of HTN, A. Fib on Eliquis, CHF, seizures, and TIA was admitted on 03/19/2016 after her family found her on the ground and minimally responsive. Work-up on admission revealed a left MCA large infarct, with a small amount of [likley subarachnoid] hemorrhage on the right. She has resultant global aphasia and right hemiplegia.  Assessment: As  reported in my consult note from 11/12; the patient's children collectively make decisions for her, as she is now unable to communicate her needs and wants. Her children are clear on their goal of maximizing her physical/functional independence, and view rehab as a conduit for that goal. Yesterday, we discussed three rehab options: home with home health, SNF with rehab, and inpatient rehab (CIR). They wished to pursue the most aggressive rehab she could tolerate. PT assessed today, and felt she would be best served by SNF/Rehab.   Recommendations/Plan:  I left a message for pt's son, Casimiro NeedleMichael, to update him on PT recommendation; he reports that they are interested in SNF and are pursuing this  Would pursue SNF/Rehab options, as this best aligns with family's stated goals; [facility]  OT re-consulted: Pt with improved alertness since prior assessment. Would benefit from ongoing evaluation and optimization of her ADLs performance capacity, and investigating alternative communication pathways given her global aphasia  Agree with WOC consult for RLE, this does appear worse from my prior assessment  *Palliative Care will sign off now. Family has clear goals of care and are working towards a safe discharge plan. Please consult us for any further needs. Thank you for letting us take care of this patient.   Goals of Care and Additional Recommendations:  Limitations on Scope of Treatment: Full Scope Treatment  Code Status:  DNR  Prognosis:   Unable to determine; pt is eating and drinking well  Discharge Planning:  Skilled Nursing Facility for rehab with Palliative care service follow-up  Care plan was discussed with Jodi Haney, message left for Montel ClockMichael  Thank you for allowing the Palliative Medicine Team to assist in the care of this patient.   Time In: 1335 Time Out: 1350 Total Time 15 minutes Prolonged Time Billed  no       Greater than 50%  of this time  was spent counseling and coordinating care related to the above assessment and plan.  Murrell ConverseSarah Ericberto Padget, NP Palliative Medicine Team Team Phone # [phone]

## 2016-03-24 NOTE — Progress Notes (Signed)
Physical Therapy Treatment Patient Details Name: Jodi FurlongSadie Haney MRN: [id number] DOB: 08/02/1925 Today's Date: 03/24/2016    History of Present Illness 82 year old woman with HTN, atrial fibrillation on Eliquis, CHF, seizures, TIA presenting after being found on ground by family members. MRI on 11/8 positive for L MCA territory affecting the deep insula, frontoparietal and parital region; punctate acute infarct in the R cerebellum.    PT Comments    Pt continues to require +2 A, but attempting to A with mobility with her L side.  Continue to recommend SNF as do not feel she would tolerate the intensity of CIR at this time.   Follow Up Recommendations  SNF;Supervision/Assistance - 24 hour     Equipment Recommendations  None recommended by PT    Recommendations for Other Services       Precautions / Restrictions Precautions Precautions: Fall Restrictions Weight Bearing Restrictions: No    Mobility  Bed Mobility Overal bed mobility: Needs Assistance;+2 for physical assistance Bed Mobility: Rolling;Sidelying to Sit;Sit to Supine Rolling: Max assist Sidelying to sit: Max assist;+2 for physical assistance   Sit to supine: Max assist;+2 for physical assistance   General bed mobility comments: Pt rolling with MAX of 1 with ability to grab rail with L hand when rolled to the R.  Pt attempting to A with mobility with L side.  At EOB, MOD A to scoot forward.  Transfers                 General transfer comment: not attempted due to balance and R leg wound  Ambulation/Gait                 Stairs            Wheelchair Mobility    Modified Rankin (Stroke Patients Only) Modified Rankin (Stroke Patients Only) Pre-Morbid Rankin Score: No symptoms Modified Rankin: Severe disability     Balance   Sitting-balance support: Feet supported;Bilateral upper extremity supported Sitting balance-Leahy Scale: Poor Sitting balance - Comments: Balance flucutated  from MAX to MIN A with tendency to lean R.   Able to follow instructions to go onto L elbow.  MAX A to come back to midline and not past it to the R. Attempted recahing activities with L UE and able to lift, but not hit target.                            Cognition Arousal/Alertness: Awake/alert Behavior During Therapy: Flat affect Overall Cognitive Status: No family/caregiver present to determine baseline cognitive functioning                 General Comments: Pt nodding yes and no, but unsure how accurate.  Shakes head no that she is not in pain, but grimaces with moving R LE. follows 1 step commands inconsistently    Exercises General Exercises - Lower Extremity Long Arc Quad: Strengthening;Left;10 reps;Seated    General Comments        Pertinent Vitals/Pain Pain Assessment: Faces Faces Pain Scale: Hurts little more Pain Location: R LE Pain Descriptors / Indicators: Grimacing Pain Intervention(s): Limited activity within patient's tolerance;Monitored during session;Repositioned    Home Living                      Prior Function            PT Goals (current goals can now be found in the care plan section) Acute  Rehab PT Goals Patient Stated Goal: family would like for pt to d/c to rehab prior to home PT Goal Formulation: With family Time For Goal Achievement: 04/03/16 Potential to Achieve Goals: Fair Progress towards PT goals: Progressing toward goals    Frequency    Min 3X/week      PT Plan Current plan remains appropriate    Co-evaluation             End of Session Equipment Utilized During Treatment: Gait belt Activity Tolerance: Patient limited by fatigue Patient left: in bed;with call bell/phone within reach;with bed alarm set     Time: 1320-1343 PT Time Calculation (min) (ACUTE ONLY): 23 min  Charges:  $Therapeutic Activity: 8-22 mins $Neuromuscular Re-education: 8-22 mins                    G Codes:       Jodi Haney 03/24/2016, 2:02 PM

## 2016-03-24 NOTE — Progress Notes (Signed)
Internal Medicine Attending:   I saw and examined the patient. I reviewed the resident's note and I agree with the resident's findings and plan as documented in the resident's note with the following additions.   Chronic Systolic CHF Her Echocardiogram which was completed as part of her CVA workup did show a dilated LV with mild concentric hypertrophy as well as a mildly reduced ejection fraction of 40-45% and elevated pulmonary artery pressures.  However on exam she is not hypoxic, her lungs are CTA and I cannot appreciated any JVD.  This is c/w chronic systolic CHF.  I would not aggressively diurese her at this point.  Given her tachycardia/ RVR we will increase her carvedilol dose to 6.25mg  BID, this may need to be increased further.   Otherwise palliative care has meet with the family and [facility]  She is otherwise stable for discharge today.

## 2016-03-24 NOTE — Care Management Important Message (Signed)
Important Message  Patient Details  Name: Jodi Haney MRN: [id number] Date of Birth: [birth year]   Medicare Important Message Given:  Yes    Jodi Haney 03/24/2016, 11:51 AM

## 2016-03-24 NOTE — Plan of Care (Signed)
Problem: Bowel/Gastric: Goal: Will not experience complications related to bowel motility Outcome: Progressing Having daily soft incontinent stools.

## 2016-03-25 ENCOUNTER — Inpatient Hospital Stay (HOSPITAL_COMMUNITY): Payer: Medicare Other

## 2016-03-25 MED ORDER — STARCH (THICKENING) PO POWD: ORAL | 1 refills | Status: DC

## 2016-03-25 MED ORDER — ASPIRIN 81 MG PO CHEW: 81.0000 mg | CHEWABLE_TABLET | Freq: Every day | ORAL | 2 refills | Status: DC

## 2016-03-25 MED ORDER — SENNOSIDES-DOCUSATE SODIUM 8.6-50 MG PO TABS
1.0000 | ORAL_TABLET | Freq: Every evening | ORAL | 1 refills | Status: DC | PRN
Start: 2016-03-25 — End: 2016-04-02

## 2016-03-25 MED ORDER — LISINOPRIL 2.5 MG PO TABS: 2.5000 mg | ORAL_TABLET | Freq: Every day | ORAL | 2 refills | Status: AC

## 2016-03-25 MED ORDER — ATORVASTATIN CALCIUM 40 MG PO TABS: 40.0000 mg | ORAL_TABLET | Freq: Every day | ORAL | 2 refills | Status: AC

## 2016-03-25 MED ORDER — LISINOPRIL 2.5 MG PO TABS
2.5000 mg | ORAL_TABLET | Freq: Every day | ORAL | Status: DC
  Administered 2016-03-25: 2.5 mg via ORAL
  Filled 2016-03-25: qty 1

## 2016-03-25 MED ORDER — CARVEDILOL 3.125 MG PO TABS: 6.2500 mg | ORAL_TABLET | Freq: Two times a day (BID) | ORAL | 2 refills | Status: AC

## 2016-03-25 MED ORDER — RESOURCE THICKENUP CLEAR PO POWD
ORAL | 2 refills | Status: DC
Start: 2016-03-25 — End: 2016-04-02

## 2016-03-25 NOTE — Progress Notes (Addendum)
Patient with 8 beat run of vtach, underlying rhythm of afib. On call notified [phone]. Return page [phone], no new orders received at this time.

## 2016-03-25 NOTE — Progress Notes (Signed)
Patient noted to have increased work of breathing, abdominal breathing, O2 sat 100% RA (pt was placed on 1L [place] to decrease work). Second RN on floor, Glass blower/designeraula (rapid response RN at times) took look at pt as well.  On call paged [phone], order placed 0230 for 1 view CXR. Continue to monitor patient.

## 2016-03-25 NOTE — Progress Notes (Signed)
Subjective: Jodi Haney remains nonverbal with RUE hemiparesis. She as been inconsistently interactive with friends and family via facial expressions. She seemed to have some difficulty breathing overnight but is doing well this morning.  Objective: Vital signs in last 24 hours: Vitals:   03/24/16 1715 03/24/16 2155 03/25/16 0205 03/25/16 0541  BP: 103/83 (!) 149/97 131/78 125/68  Pulse: (!) 125 98 (!) 143 (!) 137  Resp: 18 19 17 16   Temp: 98.7 F (37.1 C) 98.2 F (36.8 C) 98.9 F (37.2 C) 98.2 F (36.8 C)  TempSrc: Axillary Oral Oral Oral  SpO2: 93% 93% 100% 96%  Weight:      Height:        Intake/Output Summary (Last 24 hours) at 03/25/16 [id number] Last data filed at 03/24/16 2054  Gross per 24 hour  Intake              360 ml  Output                0 ml  Net              360 ml    Physical Exam General appearance: Obese elderly woman, opens eyes to voice and tracks, nonverbal, does not follow commands, sighs and softly clears throat  HENT: Normocephalic, atraumatic, moist mucous membranes Cardiovascular: Tachycardic rate and irregular rhythm, no murmurs, rubs, gallops Respiratory/Chest: Adequate breath sounds bilaterally, normal work of breathing Abdomen: Obese, bowel sounds present, soft, non-tender, non-distended Extremities: Obese bulk, RLE with ecchymoses of lower leg surrounding bandaged blistering wound Neuro: awake, does not follow commands. Unable to assess CN, RUE flaccid, lifts LUE to gravity, spontaneously moved BLEs Psych: Appears calm, otherwise unable to assess  Labs / Imaging / Procedures: CBC Latest Ref Rng & Units 03/24/2016 03/22/2016 03/21/2016  WBC 4.0 - 10.5 K/uL 7.5 8.5 8.1  Hemoglobin 12.0 - 15.0 g/dL 1.1(B8.4(L) 7.9(L) 8.2(L)  Hematocrit 36.0 - 46.0 % 26.2(L) 24.3(L) 25.5(L)  Platelets 150 - 400 K/uL 190 202 168   BMP Latest Ref Rng & Units 03/24/2016 03/22/2016 03/21/2016  Glucose 65 - 99 mg/dL 147(W110(H) 295(A115(H) 93  BUN 6 - 20 mg/dL 21(H25(H) 08(M21(H) 20   Creatinine 0.44 - 1.00 mg/dL 5.78(I1.03(H) 6.96(E1.05(H) 9.52(W1.01(H)  Sodium 135 - 145 mmol/L 144 143 143  Potassium 3.5 - 5.1 mmol/L 3.9 4.0 4.0  Chloride 101 - 111 mmol/L 113(H) 113(H) 113(H)  CO2 22 - 32 mmol/L 23 22 23   Calcium 8.9 - 10.3 mg/dL 4.1(L8.8(L) 8.9 2.4(M8.6(L)   Assessment/Plan: 82 year old woman with HTN, atrial fibrillation on Eliquis, CHF, seizures, TIA presenting after being found on ground by family members found to have multifocal CVA.  Left MCA CVA: She presented as nonverbal and unable to follow commands. Decreased movement in R UE and LE. MR/MRA brain 6-7cm region of infarction in the left MCA territory. Small amount of small subdural or subarachnoid blood on right. She was seen in consultation by neurology.  She exhibited some improvement in her functional status. Palliative consulted and family meeting beign held on 11/122, will pursue SNF/rehab and attempting maximal recovery. Hb stable around 8.0  - Family held goals of care discussion with palliative care on 11/12, plan for discharge to SNF/rehab today - Aspirin 81mg  QD - Repeat CT on 11/22 (2 weeks post-CVA) to characterize her SDH, if stable/improved and no new hemorrhage can resume Eliquis 5 BID  Right lower extremity wound: Blistering above right ankle. No evidence of cellulitis. Right ankle x-ray with soft tissue swelling with air/gas  in the soft tissues laterally. No focal bone findings. She was seen in consultation by orthopedic surgery who drained the blisters. Xeroform to be placed over the wounds afterwards. - Wound care consult place, recommend same as ortho, + float feet off bed - QD dressing changes  Atrial fibrillation: CHA2DS2-VASc of at least 7. Some ongoing tachycardia intermittently into 100s - Carvedilol 6.25 BID - Repeat CT on 11/22 to eval intracranial hemorrhage, if stable/improved start Eliquis 5mg  BID  CHF, TTE on 11/9 revealed LVEF 40-45%, could not rule out apical aneurysm, LV dilated with mild hypetrophy,  moderate mitral regurg, moderate pulmonary hypertension - Coreg 6.25 BID - Started Lisinopril 2.5mg  - Asa 81 QD  HLD -continue home Lipitor  VTE ppx: SCDs  FEN: DYS 1 diet  Dispo: Anticipated discharge today.  LOS: 6 days   Althia FortsAdam Devan Babino, MD 03/25/2016, 6:51 AM Pager: [phone]

## 2016-03-25 NOTE — Progress Notes (Signed)
Internal Medicine Attending:   I saw and examined the patient. I reviewed the resident's note and I agree with the resident's findings and plan as documented in the resident's note. She had an episode of rapid breathing overnight, without noted hypoxia.  Our resident was called to her bedside and a pCXR was obtained that showed cardiomegaly and possible mild pulmonary vascular congestion.  Today on exam she is not hypoxic and lung fields are clear to auscultation, I cannot appreciate any JVD.  We will plan to add a low dose of lisinopril to her medical regimen for systolic CHF.  Otherwise RUE weakness is unchange.  She remains nonverbal but seems to be more interactive with the medical team each day.  Her Son is again present at bedside and all questions were answered.  She will go to SNF to continue rehab.  Agree with plan outlined in Dr Henriette CombsJohnson's note re: anticoagulation.

## 2016-03-25 NOTE — Clinical Social Work Placement (Signed)
   CLINICAL SOCIAL WORK PLACEMENT  NOTE  Date:  03/25/2016  Patient Details  Name: Jodi FurlongSadie Bryant-Hall MRN: [id number] Date of Birth: July 15, 1925  Clinical Social Work is seeking post-discharge placement for this patient at the Skilled  Nursing Facility level of care (*CSW will initial, date and re-position this form in  chart as items are completed):  Yes   Patient/family provided with [place] Clinical Social Work Department's list of facilities offering this level of care within the geographic area requested by the patient (or if unable, by the patient's family).  Yes   Patient/family informed of their freedom to choose among providers that offer the needed level of care, that participate in Medicare, Medicaid or managed care program needed by the patient, have an available bed and are willing to accept the patient.  Yes   Patient/family informed of [facility] ownership interest in [facility] and [facility], as well as of the fact that they are under no obligation to receive care at these facilities.  PASRR submitted to EDS on 03/21/16     PASRR number received on 03/25/16     Existing PASRR number confirmed on       FL2 transmitted to all facilities in geographic area requested by pt/family on 03/21/16     FL2 transmitted to all facilities within larger geographic area on       Patient informed that his/her managed care company has contracts with or will negotiate with certain facilities, including the following:        Yes   Patient/family informed of bed offers received.  [facility] bed [facility]     Physician recommends and patient chooses bed at      [facility].  Patient to be transferred to facility by [facility]     Patient family notified on 03/25/16 of transfer.  Name of family member notified:  Pt's adult children     PHYSICIAN       Additional Comment:     _______________________________________________ Dede QuerySarah Azul Brumett, LCSW 03/25/2016, 8:35 PM

## 2016-03-25 NOTE — Progress Notes (Signed)
Speech Language Pathology Treatment: Dysphagia;Cognitive-Linquistic  Patient Details Name: Donalynn FurlongSadie Bryant-Hall MRN: [id number] DOB: 1925/08/15 Today's Date: 03/25/2016 Time: [id number] SLP Time Calculation (min) (ACUTE ONLY): 31 min  Assessment / Plan / Recommendation Clinical Impression  Treatment concentrated on dysphagia and aphasia providing education for son at bedside. She approximated tune with fair accuracy ffor familiar song and approximated 2 words. Exhibiting frustration which demonstrates increasing emergent awareness. SLP utilized verbal/visual cues for phrase completion, phonemic and written cues for pt's first and last name. Able to attend to SLP on right side with verbal and tactile cues. Mild-moderately increased work of breathing following tsp sip honey thick liquid. Pt not quite ready for repeat MBS to determine liquid upgrade. Will follow.    HPI HPI: 82 yr old [place] from [name] found on the floor by her son with flaccidity on right side and inability to talk. 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 Punctate acute infarction in the right cerebellum an the right occipital lobe. Small amount of subdural or subarachnoid blood on the right probably related to a fall. PMH:  seizures, A-fib.      SLP Plan  Continue with current plan of care     Recommendations  Diet recommendations: Dysphagia 1 (puree);Honey-thick liquid Liquids provided via: Teaspoon Medication Administration: Crushed with puree Supervision: Full supervision/cueing for compensatory strategies;Staff to assist with self feeding Compensations: Slow rate;Small sips/bites;Minimize environmental distractions;Lingual sweep for clearance of pocketing;Monitor for anterior loss Postural Changes and/or Swallow Maneuvers: Seated upright 90 degrees                Oral Care Recommendations: Oral care BID Follow up  Recommendations:  (TBD) Plan: Continue with current plan of care       GO                Royce MacadamiaLitaker, Ellasyn Swilling Willis 03/25/2016, 4:05 PM  Breck CoonsLisa Willis Lonell FaceLitaker M.Ed [employer] Pager [phone]

## 2016-03-25 NOTE — Clinical Social Work Note (Signed)
Pt is ready for discharge today and will [facility]. Pt and family are aware and agreeable to discharge plan. Facility is able to to accept pt as they have receive discharge information. RN called report and [facility] provided transportation. CSW is signing off as no further needs identified.   Dede QuerySarah Dyanna Seiter, MSW LCSW  Clinical Social Worker  [phone]

## 2016-03-25 NOTE — Progress Notes (Signed)
Pt discharging at this time to SNF report called in to nurse [place]. Pt with no noted distress. Dressing to right foot changed, dry and intact. IV discontinued, dry dressing applied.

## 2016-04-02 ENCOUNTER — Non-Acute Institutional Stay (SKILLED_NURSING_FACILITY): Payer: Medicare Other | Admitting: Internal Medicine

## 2016-04-02 ENCOUNTER — Ambulatory Visit (HOSPITAL_COMMUNITY)
Admission: RE | Admit: 2016-04-02 | Discharge: 2016-04-02 | Disposition: A | Discharge status: Home / Self Care | Payer: Medicare Other | Source: Ambulatory Visit | Attending: Internal Medicine | Admitting: Internal Medicine

## 2016-04-02 ENCOUNTER — Encounter: Payer: Self-pay | Admitting: Internal Medicine

## 2016-04-02 DIAGNOSIS — I10 Essential (primary) hypertension: Secondary | ICD-10-CM | POA: Diagnosis not present

## 2016-04-02 DIAGNOSIS — I69359 Hemiplegia and hemiparesis following cerebral infarction affecting unspecified side: Secondary | ICD-10-CM | POA: Diagnosis not present

## 2016-04-02 DIAGNOSIS — I6200 Nontraumatic subdural hemorrhage, unspecified: Secondary | ICD-10-CM | POA: Diagnosis not present

## 2016-04-02 DIAGNOSIS — I69320 Aphasia following cerebral infarction: Secondary | ICD-10-CM | POA: Diagnosis not present

## 2016-04-02 DIAGNOSIS — S065X9A Traumatic subdural hemorrhage with loss of consciousness of unspecified duration, initial encounter: Secondary | ICD-10-CM

## 2016-04-02 DIAGNOSIS — R569 Unspecified convulsions: Secondary | ICD-10-CM

## 2016-04-02 DIAGNOSIS — I63512 Cerebral infarction due to unspecified occlusion or stenosis of left middle cerebral artery: Secondary | ICD-10-CM

## 2016-04-02 DIAGNOSIS — D649 Anemia, unspecified: Secondary | ICD-10-CM | POA: Diagnosis not present

## 2016-04-02 DIAGNOSIS — I480 Paroxysmal atrial fibrillation: Secondary | ICD-10-CM | POA: Diagnosis not present

## 2016-04-02 DIAGNOSIS — S065XAA Traumatic subdural hemorrhage with loss of consciousness status unknown, initial encounter: Secondary | ICD-10-CM

## 2016-04-02 DIAGNOSIS — I69391 Dysphagia following cerebral infarction: Secondary | ICD-10-CM

## 2016-04-02 DIAGNOSIS — R2681 Unsteadiness on feet: Secondary | ICD-10-CM | POA: Diagnosis not present

## 2016-04-02 DIAGNOSIS — I5022 Chronic systolic (congestive) heart failure: Secondary | ICD-10-CM | POA: Diagnosis not present

## 2016-04-02 NOTE — Progress Notes (Signed)
[facility]  PCP: No primary care provider on file.   Code Status: DNR  Goals of care: Advanced Directive information Advanced Directives 04/02/2016  Does Patient Have a Medical Advance Directive? Yes  Type of Advance Directive Out of facility DNR (pink MOST or yellow form)  Does patient want to make changes to medical advance directive? No - Patient declined       Extended Emergency Contact Information Primary Emergency Contact: Bryant,Michael Address: [address]          [place], [place] [id number] [place] Home Phone: [phone] Relation: Son Secondary Emergency Contact: Marquette OldBryant,Jackie  [place] Mobile Phone: [phone] Relation: Daughter   No Known Allergies  Chief Complaint  Patient presents with  . New Admit To SNF    New Admission Visit      HPI:  Patient is a 82 y.o. female seen today for short term rehabilitation post hospital admission from 03/19/16-03/25/16 with acute left arterial ischemic stroke of MCA. Neurology was consulted. She was also noted to have subdural vs subarachnoid bleed. Her eliquis was held. She was placed on statin and baby aspirin. She has medical history of afib, chf, HTN, TIA, seizures among others. She is seen in her room today. She has global aphasia and Right sided weakness from this CVA. She does not participate in HPI and ROS.   Review of Systems: Unable to obtain from patient. Per nursing, no fall reported. She is receiving wound care to her right heel.    Past Medical History:  Diagnosis Date  . Atrial fibrillation (HCC)   . Hypertension   . Seizures (HCC)   . Stroke [facility](HCC)    Past Surgical History:  Procedure Laterality Date  . NO PAST SURGERIES     Social History:   reports that she has never smoked. She has never used smokeless tobacco. She reports that she does not drink alcohol or use drugs.  No family history on file.  Medications:   Medication List         Accurate as of 04/02/16 11:18 AM. Always use your most recent med list.          acetaminophen 325 MG tablet Commonly known as:  TYLENOL Take 650 mg by mouth 3 (three) times daily. Take every 4 hours as needed   amLODipine 5 MG tablet Commonly known as:  NORVASC Take 5 mg by mouth daily.   aspirin 81 MG chewable tablet Chew 1 tablet (81 mg total) by mouth daily.   atorvastatin 40 MG tablet Commonly known as:  LIPITOR Take 1 tablet (40 mg total) by mouth daily at 6 PM.   carvedilol 3.125 MG tablet Commonly known as:  COREG Take 2 tablets (6.25 mg total) by mouth 2 (two) times daily with a meal.   diltiazem 30 MG tablet Commonly known as:  CARDIZEM Take 30 mg by mouth every 8 (eight) hours.   food thickener Powd Commonly known as:  THICK IT For dysphagia diet   isosorbide mononitrate 30 MG 24 hr tablet Commonly known as:  IMDUR Take 30 mg by mouth daily.   levETIRAcetam 500 MG tablet Commonly known as:  KEPPRA Take 750 mg by mouth 2 (two) times daily.   lisinopril 2.5 MG tablet Commonly known as:  PRINIVIL,ZESTRIL Take 1 tablet (2.5 mg total) by mouth daily.   magnesium oxide 400 MG tablet Commonly known as:  MAG-OX Take 400 mg by mouth daily.  Immunizations:  There is no immunization history on file for this patient.   Physical Exam: Vitals:   04/02/16 1111  BP: 133/90  Pulse: 93  Resp: 20  Temp: 97.6 F (36.4 C)  TempSrc: Oral  SpO2: 97%  Weight: 188 lb (85.3 kg)  Height: 5\' 5"  (1.651 m)   Body mass index is 31.28 kg/m.  General- elderly female, overweight, in no acute distress, non verbal Head- normocephalic, atraumatic Nose- no nasal discharge Throat- moist mucus membrane Neck- no cervical lymphadenopathy Cardiovascular- irregular heart rate, no murmur Respiratory- bilateral clear to auscultation, no wheeze, no rhonchi, no crackles, no use of accessory muscles Abdomen- bowel sounds present, soft, non tender Musculoskeletal-  flaccid hemiplegia to RUE and RLE. Can move her LUE and LLE.  Neurological- has global aphasia Skin- warm and dry, right heel dressing in place Psychiatry- unable to assess    Labs reviewed: Basic Metabolic Panel:  Recent Labs  78/29/56 0531 03/22/16 0405 03/24/16 0541  NA 143 143 144  K 4.0 4.0 3.9  CL 113* 113* 113*  CO2 23 22 23   GLUCOSE 93 115* 110*  BUN 20 21* 25*  CREATININE 1.01* 1.05* 1.03*  CALCIUM 8.6* 8.9 8.8*   Liver Function Tests:  Recent Labs  03/19/16 0632  AST 27  ALT 18  ALKPHOS 52  BILITOT 1.0  PROT 6.7  ALBUMIN 4.2   No results for input(s): LIPASE, AMYLASE in the last 8760 hours. No results for input(s): AMMONIA in the last 8760 hours. CBC:  Recent Labs  03/19/16 0632  03/21/16 1021 03/22/16 0405 03/24/16 0541  WBC 9.8  < > 8.1 8.5 7.5  NEUTROABS 8.2*  --   --   --   --   HGB 10.4*  < > 8.2* 7.9* 8.4*  HCT 31.4*  < > 25.5* 24.3* 26.2*  MCV 89.7  < > 91.4 91.4 91.3  PLT 241  < > 168 202 190  < > = values in this interval not displayed. Cardiac Enzymes:  Recent Labs  03/19/16 0752  CKTOTAL 132   BNP: Invalid input(s): POCBNP CBG: No results for input(s): GLUCAP in the last 8760 hours.  Radiological Exams: Ct Angio Head W Or Wo Contrast  Result Date: 03/20/2016 CLINICAL DATA:  Found on floor yesterday.  Stroke. EXAM: CT ANGIOGRAPHY HEAD AND NECK TECHNIQUE: Multidetector CT imaging of the head and neck was performed using the standard protocol during bolus administration of intravenous contrast. Multiplanar CT image reconstructions and MIPs were obtained to evaluate the vascular anatomy. Carotid stenosis measurements (when applicable) are obtained utilizing NASCET criteria, using the distal internal carotid diameter as the denominator. CONTRAST:  80 cc Isovue 370 intravenous COMPARISON:  Yesterday FINDINGS: CT HEAD FINDINGS Brain: Sizable infarct in the posterior division left MCA territory with more well-defined cytotoxic edema.  Local mass effect without midline shift. No hemorrhagic conversion. The right superior cerebellar infarct by MRI is not clearly seen by CT. Stable intermittently seen extra-axial hemorrhage around the right cerebral convexity. Stable CSF density prominence around the cerebral convexities, possible hygromas. Vascular: No proximal hyper dense vessel. Skull: Negative for fracture Sinuses: Mucous retention cysts in the left maxillary antrum. Orbits: No acute finding Review of the MIP images confirms the above findings CTA NECK FINDINGS Aortic arch: Atherosclerosis without acute finding.  No ulceration. Right carotid system: Predominant calcified plaque brachiocephalic origin. Carotid branches are smooth and widely patent. No flow limiting stenosis. Left carotid system: Atherosclerotic plaque at the origin. No stenosis, dissection, or  ulceration Vertebral arteries: Symmetric vertebral arteries, smooth and widely patent. No proximal subclavian stenosis. Skeleton: No acute or aggressive finding. Usual degenerative changes. Other neck: No incidental mass or adenopathy Upper chest: Mosaic attenuation lungs favoring air trapping given degree of airway thickening and intermittent airway collapse. Review of the MIP images confirms the above findings CTA HEAD FINDINGS Anterior circulation: Symmetric carotid arteries. Left M2 branch occlusion correlating with the acute infarct. No more proximal flow limiting stenosis. No other major branch occlusion. Negative for aneurysm Posterior circulation: Unremarkable vertebrobasilar branching. Mild atherosclerotic luminal irregularity. No major branch occlusion or treatable flow limiting stenosis. Venous sinuses: Patent Anatomic variants: None significant Delayed phase: No parenchymal enhancement or mass. Review of the MIP images confirms the above findings IMPRESSION: 1. Distal left M2 branch occlusion correlating with acute left MCA territory infarct. No more proximal flow limiting  stenosis. 2. Atherosclerosis that is overall mild for age. No stenosis or embolic source seen in the neck. 3. Stable small volume extra-axial hemorrhage around the right cerebral convexity. 4. Heterogeneous lung aeration, likely small airways disease with air trapping. Electronically Signed   By: Marnee SpringJonathon  Watts M.D.   On: 03/20/2016 09:52   Dg Chest 1 View  Result Date: 03/19/2016 CLINICAL DATA:  Altered mental status with stroke symptoms. EXAM: CHEST 1 VIEW COMPARISON:  None. FINDINGS: Enlargement of the cardiac silhouette. Lungs are clear without pulmonary edema or focal airspace disease. Negative for a pneumothorax. Trachea is mildly deviated towards the right. No acute bone abnormality. IMPRESSION: Cardiomegaly without focal lung disease. Electronically Signed   By: Richarda OverlieAdam  Henn M.D.   On: 03/19/2016 07:44   Dg Wrist Complete Right  Result Date: 03/19/2016 CLINICAL DATA:  Altered mental status.  Swelling and bruising. EXAM: RIGHT WRIST - COMPLETE 3+ VIEW COMPARISON:  None. FINDINGS: There chronic osteoarthritic changes at the articulation between the navicular and the multangular bones. No evidence of fracture or dislocation. IMPRESSION: Radial side carpal arthritic changes. No acute or traumatic finding. Electronically Signed   By: Paulina FusiMark  Shogry M.D.   On: 03/19/2016 09:18   Dg Ankle Complete Right  Result Date: 03/19/2016 CLINICAL DATA:  Bruising of the foot and ankle with blisters. EXAM: RIGHT ANKLE - COMPLETE 3+ VIEW COMPARISON:  None. FINDINGS: Marked regional soft tissue swelling. Air/ gas in the soft tissues lateral to the ankle region. The bones are osteopenic. No evidence of focal destruction or lysis. IMPRESSION: Soft-tissue swelling with air/ gas in the soft tissues laterally. No focal bone finding. Electronically Signed   By: Paulina FusiMark  Shogry M.D.   On: 03/19/2016 09:17   Ct Head Wo Contrast  Result Date: 03/19/2016 CLINICAL DATA:  Found on floor this morning with leftward gaze and aphasia.  Concern for stroke. EXAM: CT HEAD WITHOUT CONTRAST CT CERVICAL SPINE WITHOUT CONTRAST TECHNIQUE: Multidetector CT imaging of the head and cervical spine was performed following the standard protocol without intravenous contrast. Multiplanar CT image reconstructions of the cervical spine were also generated. COMPARISON:  None. FINDINGS: CT HEAD FINDINGS Brain: There is hypoattenuation involving the posterior left insula and cortex and white matter in the left frontoparietal region most consistent with acute moderate-sized MCA territory infarct. The there is prominence of the extra-axial CSF spaces over both frontal lobes which may reflect the atrophy or small subdural hygromas. Overlying the right cerebral hemisphere in the frontal and parietal regions are small foci of extra-axial hemorrhage which are favored to be subarachnoid though a a component of subdural hemorrhage is possible. The ventricles  are normal in size for age. There is no evidence of mass or midline shift. Periventricular white matter hypodensities are nonspecific but compatible with chronic small vessel ischemic disease. There is a chronic lacunar infarct in the right caudate head. Vascular: No hyperdense vessel. Calcified atherosclerosis at the skullbase. Skull: No fracture or suspicious osseous lesion. Sinuses/Orbits: Left maxillary sinus mucous retention cyst. Minimal left frontal and left ethmoid sinus mucosal thickening. Visualized mastoid air cells are clear. Unremarkable orbits. Other: None. CT CERVICAL SPINE FINDINGS Alignment: Cervical spine straightening.  No subluxation. Skull base and vertebrae: No acute fracture or destructive osseous lesion. Incidental incomplete fusion of the C1 posterior ring. Soft tissues and spinal canal: No prevertebral fluid or swelling. No visible canal hematoma. Disc levels: Mild disc space narrowing at C3-4 with mild-to-moderate narrowing from C4-5 to C6-7. Upper chest: Unremarkable. Other: Partially  retropharyngeal course of the carotid arteries. IMPRESSION: 1. Moderate-sized acute left MCA infarct. 2. Small volume acute extra-axial hemorrhage over the right cerebral convexity, likely subarachnoid. 3. Enlarged CSF spaces over the frontal lobes, likely secondary to atrophy though small subdural hygromas are also possible. 4. No evidence of acute osseous abnormality in the cervical spine. These results were called by telephone at the time of interpretation on 03/19/2016 at 7:59 am to Dr. Donnald Garre, who verbally acknowledged these results. Electronically Signed   By: Sebastian Ache M.D.   On: 03/19/2016 08:02   Ct Angio Neck W Or Wo Contrast  Result Date: 03/20/2016 CLINICAL DATA:  Found on floor yesterday.  Stroke. EXAM: CT ANGIOGRAPHY HEAD AND NECK TECHNIQUE: Multidetector CT imaging of the head and neck was performed using the standard protocol during bolus administration of intravenous contrast. Multiplanar CT image reconstructions and MIPs were obtained to evaluate the vascular anatomy. Carotid stenosis measurements (when applicable) are obtained utilizing NASCET criteria, using the distal internal carotid diameter as the denominator. CONTRAST:  80 cc Isovue 370 intravenous COMPARISON:  Yesterday FINDINGS: CT HEAD FINDINGS Brain: Sizable infarct in the posterior division left MCA territory with more well-defined cytotoxic edema. Local mass effect without midline shift. No hemorrhagic conversion. The right superior cerebellar infarct by MRI is not clearly seen by CT. Stable intermittently seen extra-axial hemorrhage around the right cerebral convexity. Stable CSF density prominence around the cerebral convexities, possible hygromas. Vascular: No proximal hyper dense vessel. Skull: Negative for fracture Sinuses: Mucous retention cysts in the left maxillary antrum. Orbits: No acute finding Review of the MIP images confirms the above findings CTA NECK FINDINGS Aortic arch: Atherosclerosis without acute finding.   No ulceration. Right carotid system: Predominant calcified plaque brachiocephalic origin. Carotid branches are smooth and widely patent. No flow limiting stenosis. Left carotid system: Atherosclerotic plaque at the origin. No stenosis, dissection, or ulceration Vertebral arteries: Symmetric vertebral arteries, smooth and widely patent. No proximal subclavian stenosis. Skeleton: No acute or aggressive finding. Usual degenerative changes. Other neck: No incidental mass or adenopathy Upper chest: Mosaic attenuation lungs favoring air trapping given degree of airway thickening and intermittent airway collapse. Review of the MIP images confirms the above findings CTA HEAD FINDINGS Anterior circulation: Symmetric carotid arteries. Left M2 branch occlusion correlating with the acute infarct. No more proximal flow limiting stenosis. No other major branch occlusion. Negative for aneurysm Posterior circulation: Unremarkable vertebrobasilar branching. Mild atherosclerotic luminal irregularity. No major branch occlusion or treatable flow limiting stenosis. Venous sinuses: Patent Anatomic variants: None significant Delayed phase: No parenchymal enhancement or mass. Review of the MIP images confirms the above findings IMPRESSION: 1. Distal  left M2 branch occlusion correlating with acute left MCA territory infarct. No more proximal flow limiting stenosis. 2. Atherosclerosis that is overall mild for age. No stenosis or embolic source seen in the neck. 3. Stable small volume extra-axial hemorrhage around the right cerebral convexity. 4. Heterogeneous lung aeration, likely small airways disease with air trapping. Electronically Signed   By: Marnee Spring M.D.   On: 03/20/2016 09:52   Ct Cervical Spine Wo Contrast  Result Date: 03/19/2016 CLINICAL DATA:  Found on floor this morning with leftward gaze and aphasia. Concern for stroke. EXAM: CT HEAD WITHOUT CONTRAST CT CERVICAL SPINE WITHOUT CONTRAST TECHNIQUE: Multidetector CT  imaging of the head and cervical spine was performed following the standard protocol without intravenous contrast. Multiplanar CT image reconstructions of the cervical spine were also generated. COMPARISON:  None. FINDINGS: CT HEAD FINDINGS Brain: There is hypoattenuation involving the posterior left insula and cortex and white matter in the left frontoparietal region most consistent with acute moderate-sized MCA territory infarct. The there is prominence of the extra-axial CSF spaces over both frontal lobes which may reflect the atrophy or small subdural hygromas. Overlying the right cerebral hemisphere in the frontal and parietal regions are small foci of extra-axial hemorrhage which are favored to be subarachnoid though a a component of subdural hemorrhage is possible. The ventricles are normal in size for age. There is no evidence of mass or midline shift. Periventricular white matter hypodensities are nonspecific but compatible with chronic small vessel ischemic disease. There is a chronic lacunar infarct in the right caudate head. Vascular: No hyperdense vessel. Calcified atherosclerosis at the skullbase. Skull: No fracture or suspicious osseous lesion. Sinuses/Orbits: Left maxillary sinus mucous retention cyst. Minimal left frontal and left ethmoid sinus mucosal thickening. Visualized mastoid air cells are clear. Unremarkable orbits. Other: None. CT CERVICAL SPINE FINDINGS Alignment: Cervical spine straightening.  No subluxation. Skull base and vertebrae: No acute fracture or destructive osseous lesion. Incidental incomplete fusion of the C1 posterior ring. Soft tissues and spinal canal: No prevertebral fluid or swelling. No visible canal hematoma. Disc levels: Mild disc space narrowing at C3-4 with mild-to-moderate narrowing from C4-5 to C6-7. Upper chest: Unremarkable. Other: Partially retropharyngeal course of the carotid arteries. IMPRESSION: 1. Moderate-sized acute left MCA infarct. 2. Small volume acute  extra-axial hemorrhage over the right cerebral convexity, likely subarachnoid. 3. Enlarged CSF spaces over the frontal lobes, likely secondary to atrophy though small subdural hygromas are also possible. 4. No evidence of acute osseous abnormality in the cervical spine. These results were called by telephone at the time of interpretation on 03/19/2016 at 7:59 am to Dr. Donnald Garre, who verbally acknowledged these results. Electronically Signed   By: Sebastian Ache M.D.   On: 03/19/2016 08:02   Mr Brain Wo Contrast  Result Date: 03/19/2016 CLINICAL DATA:  Found unresponsive on the floor this morning with right-sided paralysis and aphasia. EXAM: MRI HEAD WITHOUT CONTRAST MRA HEAD WITHOUT CONTRAST TECHNIQUE: Multiplanar, multiecho pulse sequences of the brain and surrounding structures were obtained without intravenous contrast. Angiographic images of the head were obtained using MRA technique without contrast. COMPARISON:  Head CT same day FINDINGS: MRI HEAD FINDINGS Brain: Punctate acute infarction in the right superior cerebellum. Punctate acute infarction in the right occipital lobe. Confluent acute infarction affecting a the 6-7 cm region of the left cerebral hemisphere in the MCA territory affecting the deep insula, frontoparietal and parietal region. Mild swelling but no hemorrhage or shift at this time. Small amount of acute subarachnoid or  subdural hemorrhage along the convexity on the right, presumably secondary to a fall. Background pattern of mild to moderate chronic small-vessel change within the white matter. Vascular: Major vessels at the base of the brain show flow. See below. Skull and upper cervical spine: Negative Sinuses/Orbits: Left maxillary sinus retention cyst. No inflammatory disease. Other: None significant MRA HEAD FINDINGS Markedly degraded by motion. Antegrade flow present within the internal carotid artery's through the skullbase. Flow is seen in the proximal anterior and middle cerebral  vessels bilaterally. Both vertebral arteries show flow to the basilar. Flow is present in both posterior cerebral arteries. IMPRESSION: 6-7 cm region of confluent infarction in the left MCA territory affecting the deep insula, frontoparietal and parietal region. Mild swelling but no hemorrhage. Consistent with posterior division MCA branch vessel occlusion. Punctate acute infarction in the right cerebellum an the right occipital lobe. Therefore, concern regarding embolic disease from the heart or ascending aorta. Small amount of subdural or subarachnoid blood on the right probably related to a fall. Markedly degraded MRA. Major vessels at the base of the brain show flow. Electronically Signed   By: Paulina Fusi M.D.   On: 03/19/2016 12:25   Ct Hip Right Wo Contrast  Result Date: 03/19/2016 CLINICAL DATA:  Right hip pain status post fall today. EXAM: CT OF THE RIGHT HIP WITHOUT CONTRAST TECHNIQUE: Multidetector CT imaging of the right hip was performed according to the standard protocol. Multiplanar CT image reconstructions were also generated. COMPARISON:  Radiographs same date. FINDINGS: Examination is limited to the right hip and inferior right hemipelvis. Bones/Joint/Cartilage The mineralization and alignment are normal. There is no evidence of acute fracture, dislocation or femoral head avascular necrosis. No significant hip arthropathy for age. Mild degenerative changes are present within the right sacroiliac joint and visualized lower lumbar spine. Ligaments Not relevant for exam/indication. Muscles and Tendons Unremarkable. Soft tissues No evidence of periarticular hematoma or other fluid collection. There is mild atherosclerosis of the right iliac and femoral vasculature. There is a large ventral hernia containing colon, incompletely visualized. No signs of obstruction by this examination. IMPRESSION: 1. No acute osseous findings demonstrated at the right hip. 2. Underlying mild degenerative changes  within the visualized lower lumbar spine and right sacroiliac joint. 3. Large ventral hernia containing colon, incompletely visualized. Electronically Signed   By: Carey Bullocks M.D.   On: 03/19/2016 09:57   Dg Chest Port 1 View  Result Date: 03/25/2016 CLINICAL DATA:  Shortness of breath atrial fibrillation, chronic CHF, chronic renal insufficiency, CVA. EXAM: PORTABLE CHEST 1 VIEW COMPARISON:  Portable chest x-ray of March 19, 2016 FINDINGS: The lungs are adequately inflated. The cardiac silhouette is enlarged. The pulmonary vascularity is prominent centrally with mild cephalization noted. There is calcification in the wall of the aortic arch. There is no pleural effusion or alveolar infiltrate. The bony thorax exhibits no acute abnormality. IMPRESSION: Mild CHF with slight increased interstitial prominence. Probable underlying chronic bronchitic changes. No pneumonia is observed. Aortic atherosclerosis. Electronically Signed   By: David  [place] M.D.   On: 03/25/2016 07:17   Dg Humerus Right  Result Date: 03/19/2016 CLINICAL DATA:  Bruising.  No laceration. EXAM: RIGHT HUMERUS - 2+ VIEW COMPARISON:  None. FINDINGS: There is no evidence of fracture or other focal bone lesions. Soft tissues are unremarkable. IMPRESSION: Negative. Electronically Signed   By: Paulina Fusi M.D.   On: 03/19/2016 09:19   Dg Swallowing Func-speech Pathology  Result Date: 03/20/2016 Objective Swallowing Evaluation: Type of Study: MBS-Modified  Barium Swallow Study Patient Details Name: Jodi Haney MRN: [id number] Date of Birth: 03-05-1926 Today's Date: 03/20/2016 Time: SLP Start Time (ACUTE ONLY): 1003-SLP Stop Time (ACUTE ONLY): 1020 SLP Time Calculation (min) (ACUTE ONLY): 17 min Past Medical History: Past Medical History: Diagnosis Date . Atrial fibrillation (HCC)  . Hypertension  . Seizures (HCC)  . Stroke [facility])  Past Surgical History: Past Surgical History: Procedure Laterality Date . NO PAST SURGERIES   HPI: 82 yr  old visiting from [place] found on the floor by her son with flaccidity on right side and inability to talk. 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 Punctate acute infarction in the right cerebellum an the right occipital lobe. Small amount of subdural or subarachnoid blood on the right probably related to a fall. PMH:  seizures, A-fib. No Data Recorded Assessment / Plan / Recommendation CHL IP CLINICAL IMPRESSIONS 03/20/2016 Therapy Diagnosis Moderate oral phase dysphagia;Severe oral phase dysphagia;Mild pharyngeal phase dysphagia Clinical Impression Decreased lingual cohesion with sublingual residue, intermittent holding and right labial spillage. Pharyngeal phase marked by intermittent delayed swallow initiation and  pharyngeal residue. Audible aspiration with nectar thick via teaspoon and honey thick via teaspoon not penetrated or aspirated. Recommend Dys 1, honey thick via teaspoon ONLY, full supervision, watch for swallow, slow pace and crush meds.  Impact on safety and function Moderate aspiration risk   CHL IP TREATMENT RECOMMENDATION 03/20/2016 Treatment Recommendations Therapy as outlined in treatment plan below   Prognosis 03/20/2016 Prognosis for Safe Diet Advancement Fair Barriers to Reach Goals -- Barriers/Prognosis Comment -- CHL IP DIET RECOMMENDATION 03/20/2016 SLP Diet Recommendations Dysphagia 1 (Puree) solids;Honey thick liquids Liquid Administration via -- Medication Administration Crushed with puree Compensations Slow rate;Small sips/bites;Minimize environmental distractions;Lingual sweep for clearance of pocketing;Monitor for anterior loss Postural Changes --   CHL IP OTHER RECOMMENDATIONS 03/20/2016 Recommended Consults -- Oral Care Recommendations Oral care BID Other Recommendations Order thickener from pharmacy   CHL IP FOLLOW UP RECOMMENDATIONS 03/20/2016 Follow up Recommendations Inpatient Rehab   CHL IP  FREQUENCY AND DURATION 03/20/2016 Speech Therapy Frequency (ACUTE ONLY) min 2x/week Treatment Duration 2 weeks      CHL IP ORAL PHASE 03/20/2016 Oral Phase Impaired Oral - Pudding Teaspoon -- Oral - Pudding Cup -- Oral - Honey Teaspoon Delayed oral transit;Decreased bolus cohesion;Holding of bolus;Right anterior bolus loss Oral - Honey Cup Delayed oral transit;Decreased bolus cohesion;Holding of bolus;Right anterior bolus loss Oral - Nectar Teaspoon Delayed oral transit;Decreased bolus cohesion;Holding of bolus;Right anterior bolus loss Oral - Nectar Cup -- Oral - Nectar Straw -- Oral - Thin Teaspoon -- Oral - Thin Cup -- Oral - Thin Straw -- Oral - Puree Delayed oral transit;Decreased bolus cohesion;Holding of bolus;Right anterior bolus loss Oral - Mech Soft -- Oral - Regular -- Oral - Multi-Consistency -- Oral - Pill -- Oral Phase - Comment --  CHL IP PHARYNGEAL PHASE 03/20/2016 Pharyngeal Phase Impaired Pharyngeal- Pudding Teaspoon -- Pharyngeal -- Pharyngeal- Pudding Cup -- Pharyngeal -- Pharyngeal- Honey Teaspoon Pharyngeal residue - valleculae;Pharyngeal residue - pyriform;Delayed swallow initiation-vallecula Pharyngeal -- Pharyngeal- Honey Cup Pharyngeal residue - valleculae;Pharyngeal residue - pyriform Pharyngeal -- Pharyngeal- Nectar Teaspoon Penetration/Aspiration during swallow;Delayed swallow initiation-vallecula Pharyngeal Material enters airway, passes BELOW cords and not ejected out despite cough attempt by patient Pharyngeal- Nectar Cup -- Pharyngeal -- Pharyngeal- Nectar Straw -- Pharyngeal -- Pharyngeal- Thin Teaspoon -- Pharyngeal -- Pharyngeal- Thin Cup -- Pharyngeal -- Pharyngeal- Thin Straw -- Pharyngeal --  Pharyngeal- Puree WFL Pharyngeal -- Pharyngeal- Mechanical Soft -- Pharyngeal -- Pharyngeal- Regular -- Pharyngeal -- Pharyngeal- Multi-consistency -- Pharyngeal -- Pharyngeal- Pill -- Pharyngeal -- Pharyngeal Comment --  CHL IP CERVICAL ESOPHAGEAL PHASE 03/20/2016 Cervical Esophageal Phase WFL  Pudding Teaspoon -- Pudding Cup -- Honey Teaspoon -- Honey Cup -- Nectar Teaspoon -- Nectar Cup -- Nectar Straw -- Thin Teaspoon -- Thin Cup -- Thin Straw -- Puree -- Mechanical Soft -- Regular -- Multi-consistency -- Pill -- Cervical Esophageal Comment -- No flowsheet data found. Royce Macadamia 03/20/2016, 3:25 PM Breck Coons Lonell Face.Ed CCC-SLP Pager [phone]              Mr Maxine Glenn Head/brain AV Cm  Result Date: 03/19/2016 CLINICAL DATA:  Found unresponsive on the floor this morning with right-sided paralysis and aphasia. EXAM: MRI HEAD WITHOUT CONTRAST MRA HEAD WITHOUT CONTRAST TECHNIQUE: Multiplanar, multiecho pulse sequences of the brain and surrounding structures were obtained without intravenous contrast. Angiographic images of the head were obtained using MRA technique without contrast. COMPARISON:  Head CT same day FINDINGS: MRI HEAD FINDINGS Brain: Punctate acute infarction in the right superior cerebellum. Punctate acute infarction in the right occipital lobe. Confluent acute infarction affecting a the 6-7 cm region of the left cerebral hemisphere in the MCA territory affecting the deep insula, frontoparietal and parietal region. Mild swelling but no hemorrhage or shift at this time. Small amount of acute subarachnoid or subdural hemorrhage along the convexity on the right, presumably secondary to a fall. Background pattern of mild to moderate chronic small-vessel change within the white matter. Vascular: Major vessels at the base of the brain show flow. See below. Skull and upper cervical spine: Negative Sinuses/Orbits: Left maxillary sinus retention cyst. No inflammatory disease. Other: None significant MRA HEAD FINDINGS Markedly degraded by motion. Antegrade flow present within the internal carotid artery's through the skullbase. Flow is seen in the proximal anterior and middle cerebral vessels bilaterally. Both vertebral arteries show flow to the basilar. Flow is present in both posterior  cerebral arteries. IMPRESSION: 6-7 cm region of confluent infarction in the left MCA territory affecting the deep insula, frontoparietal and parietal region. Mild swelling but no hemorrhage. Consistent with posterior division MCA branch vessel occlusion. Punctate acute infarction in the right cerebellum an the right occipital lobe. Therefore, concern regarding embolic disease from the heart or ascending aorta. Small amount of subdural or subarachnoid blood on the right probably related to a fall. Markedly degraded MRA. Major vessels at the base of the brain show flow. Electronically Signed   By: Paulina Fusi M.D.   On: 03/19/2016 12:25   Dg Hip Unilat W Or Wo Pelvis 2-3 Views Right  Result Date: 03/19/2016 CLINICAL DATA:  Stroke. EXAM: DG HIP (WITH OR WITHOUT PELVIS) 2-3V RIGHT COMPARISON:  No recent prior. FINDINGS: Degenerative changes lumbar spine and both hips. Diffuse osteopenia. Surgical sutures in the pelvis. Pelvic calcifications consistent with phleboliths. IMPRESSION: No acute or focal abnormality. Diffuse osteopenia and degenerative change. Electronically Signed   By: Maisie Fus  Register   On: 03/19/2016 07:42    Assessment/Plan  Unsteady gait With right sided hemiplegia. Will have her work with physical therapy and occupational therapy team to help with gait training and muscle strengthening exercises.fall precautions. Skin care. Encourage to be out of bed.   Acute left MCA stroke With residual deficit. Will have patient work with PT/OT as tolerated to regain strength and restore function.  Fall precautions are in place. Continue aspirin and statin for  now. Neurology follow up.   Global aphasia post cva SLP to evaluate. Will need supportive care  Right hemiplegia Will have her work with physical therapy and occupational therapy team to help with gait training and muscle strengthening exercises.fall precautions. Skin care. Encourage to be out of bed.   Right subdural hematoma Her  eliquis is currently on hold and she is on baby aspirin. Will need repeat ct head to confirm stability vs resolution of the hematoma prior to resuming eliquis. CT head to be scheduled and to review result to decide on resuming eliquis  afib  Rate controlled but has irregular rhythm. eliquis on hold with subdural hematoma at present. Continue coreg 6.125 mg bid and cardizem 30 mg tid for rate control. To review ct head result and is hematoma stable or resolved, to resume eliquis at 5 mg bid for stroke prophylaxis  Chronic systolic chf EF 45%. Continue lisinopril 2.5 mg daily and coreg 6.25 mg bid with isosorbide mononitrate 30 mg daily  Dysphagia Post cva, continue puree feed and honey thick liquid, SLP to follow, apsiration precautions and full assistance with feeding  Anemia Likely from her subdural hematoma. Monitor cbc  Seizure None at present. Continue keppra 750 mg bid  HTN Stable, monitor bp, continue norvasc 5 mg daily with coreg, isosorbide mononitrate and lisinopril    Goals of care: short term rehabilitation   Labs/tests ordered: cbc, cmp 04/07/16  Family/ staff Communication: reviewed care plan with patient's nursing supervisor    Oneal Grout, MD Internal Medicine [facility] [address] [place], [place] [address] Cell Phone (Monday-Friday 8 am - 5 pm): [phone] On Call: [phone] and follow prompts after 5 pm and on weekends Office Phone: [phone] Office Fax: [phone]

## 2016-04-07 ENCOUNTER — Non-Acute Institutional Stay (SKILLED_NURSING_FACILITY): Payer: Medicare Other | Admitting: Family

## 2016-04-07 DIAGNOSIS — I4891 Unspecified atrial fibrillation: Secondary | ICD-10-CM

## 2016-04-07 DIAGNOSIS — G4700 Insomnia, unspecified: Secondary | ICD-10-CM

## 2016-04-07 LAB — HEPATIC FUNCTION PANEL
ALK PHOS: 77 U/L (ref 25–125)
ALT: 23 U/L (ref 7–35)
AST: 22 U/L (ref 13–35)
Bilirubin, Total: 0.7 mg/dL

## 2016-04-07 LAB — BASIC METABOLIC PANEL
BUN: 19 mg/dL (ref 4–21)
Creatinine: 0.9 mg/dL (ref 0.5–1.1)
GLUCOSE: 97 mg/dL
POTASSIUM: 4 mmol/L (ref 3.4–5.3)
SODIUM: 146 mmol/L (ref 137–147)

## 2016-04-07 LAB — CBC AND DIFFERENTIAL
HCT: 31 % — AB (ref 36–46)
HEMOGLOBIN: 9.7 g/dL — AB (ref 12.0–16.0)
Platelets: 317 10*3/uL (ref 150–399)
WBC: 9 10^3/mL

## 2016-04-07 MED ORDER — APIXABAN 2.5 MG PO TABS: 5.0000 mg | ORAL_TABLET | Freq: Two times a day (BID) | ORAL | Status: AC

## 2016-04-07 MED ORDER — TRAZODONE HCL 50 MG PO TABS: 25.0000 mg | ORAL_TABLET | Freq: Every evening | ORAL | 3 refills | Status: AC | PRN

## 2016-04-07 NOTE — Progress Notes (Signed)
Location:  [facility] Room Number: 106  Place of Service:  SNF (31) Provider: Richarda Bladeinah Skyler Carel FNP-C   No care team member to display  Extended Emergency Contact Information Primary Emergency Contact: Bryant,Michael Address: [address]          [place], [place] [address] [place] Home Phone: [phone] Relation: Son Secondary Emergency Contact: Marquette OldBryant,Jackie  [place] Mobile Phone: [phone] Relation: Daughter  Code Status: DNR  Goals of care: Advanced Directive information Advanced Directives 04/02/2016  Does Patient Have a Medical Advance Directive? Yes  Type of Advance Directive Out of facility DNR (pink MOST or yellow form)  Does patient want to make changes to medical advance directive? No - Patient declined     Chief Complaint  Patient presents with  . Acute Visit    insomnia    HPI:  Pt is a 82 y.o. female seen today at [facility] for an acute visit for evaluation of inability to sleep at bedtime.She has a medical history of CHF, Afib, CVA with right side hemiparesis, CKD stage 3 among other conditions. She is seen in her room today per facility Nurse request. Facility Nurse reports patient not sleeping during the night. Pain medication given for moaning with much relief. Also states patient rolled out of the bed no injuries sustained. She is nonverbal during visit but nods head for yes or no questions. She denies any fever, chills or cough. Per Dr. Glade LloydPandey patient's recent CT scan report from Neurology stable compared to previous CT scan. Patient okay to restart EliQuis 5 mg Tablet twice daily.    Past Medical History:  Diagnosis Date  . Atrial fibrillation (HCC)   . Hypertension   . Seizures (HCC)   . Stroke [facility](HCC)    Past Surgical History:  Procedure Laterality Date  . NO PAST SURGERIES      No Known Allergies    Medication List       Accurate as of 04/07/16  3:36  PM. Always use your most recent med list.          acetaminophen 325 MG tablet Commonly known as:  TYLENOL Take 650 mg by mouth every 4 (four) hours.   amLODipine 5 MG tablet Commonly known as:  NORVASC Take 5 mg by mouth daily.   atorvastatin 40 MG tablet Commonly known as:  LIPITOR Take 1 tablet (40 mg total) by mouth daily at 6 PM.   carvedilol 3.125 MG tablet Commonly known as:  COREG Take 2 tablets (6.25 mg total) by mouth 2 (two) times daily with a meal.   diltiazem 30 MG tablet Commonly known as:  CARDIZEM Take 30 mg by mouth every 8 (eight) hours.   food thickener Powd Commonly known as:  THICK IT For dysphagia diet   isosorbide mononitrate 30 MG 24 hr tablet Commonly known as:  IMDUR Take 30 mg by mouth daily.   levETIRAcetam 500 MG tablet Commonly known as:  KEPPRA Take 750 mg by mouth 2 (two) times daily.   lisinopril 2.5 MG tablet Commonly known as:  PRINIVIL,ZESTRIL Take 1 tablet (2.5 mg total) by mouth daily.   magnesium oxide 400 MG tablet Commonly known as:  MAG-OX Take 400 mg by mouth daily.   traZODone 50 MG tablet Commonly known as:  DESYREL Take 0.5 tablets (25 mg total) by mouth at bedtime as needed for sleep.       Review of Systems  Unable to perform ROS: Patient nonverbal     There is no immunization history on file for this patient. Pertinent  Health Maintenance Due  Topic Date Due  . DEXA SCAN  12/27/1992  . PNA vac Low Risk Adult (1 of 2 - PCV13) 12/27/1992  . INFLUENZA VACCINE  12/11/2015   No flowsheet data found. Functional Status Survey:    Vitals:   04/07/16 1420  BP: 120/77  Pulse: 83  Resp: 18  Temp: (!) 96.4 F (35.8 C)  SpO2: 94%  Weight: 188 lb (85.3 kg)  Height: 5\' 5"  (1.651 m)   Body mass index is 31.28 kg/m. Physical Exam  Constitutional: She appears well-developed and well-nourished. No distress.  HENT:  Head: Normocephalic.  Mouth/Throat: Oropharynx is clear and moist. No oropharyngeal  exudate.  Eyes: Conjunctivae and EOM are normal. Pupils are equal, round, and reactive to light. Right eye exhibits no discharge. Left eye exhibits no discharge. No scleral icterus.  Neck: Normal range of motion. No JVD present. No thyromegaly present.  Cardiovascular: Normal rate, normal heart sounds and intact distal pulses.  Exam reveals no gallop and no friction rub.   No murmur heard. Pulmonary/Chest: Effort normal and breath sounds normal. No respiratory distress. She has no wheezes.  Abdominal: Soft. Bowel sounds are normal. She exhibits no distension. There is no tenderness. There is no rebound and no guarding.  Genitourinary:  Genitourinary Comments: incontinent  Musculoskeletal: She exhibits no edema, tenderness or deformity.  Right side weakness   Lymphadenopathy:    She has no cervical adenopathy.  Neurological: She is alert.  Nonverbal but nods head for yes or no questions.   Skin: Skin is warm and dry. No rash noted. No erythema. No pallor.  Psychiatric: She has a normal mood and affect.    Labs reviewed:  Recent Labs  03/21/16 0531 03/22/16 0405 03/24/16 0541  NA 143 143 144  K 4.0 4.0 3.9  CL 113* 113* 113*  CO2 23 22 23   GLUCOSE 93 115* 110*  BUN 20 21* 25*  CREATININE 1.01* 1.05* 1.03*  CALCIUM 8.6* 8.9 8.8*    Recent Labs  03/19/16 0632  AST 27  ALT 18  ALKPHOS 52  BILITOT 1.0  PROT 6.7  ALBUMIN 4.2    Recent Labs  03/19/16 0632  03/21/16 1021 03/22/16 0405 03/24/16 0541  WBC 9.8  < > 8.1 8.5 7.5  NEUTROABS 8.2*  --   --   --   --   HGB 10.4*  < > 8.2* 7.9* 8.4*  HCT 31.4*  < > 25.5* 24.3* 26.2*  MCV 89.7  < > 91.4 91.4 91.3  PLT 241  < > 168 202 190  < > = values in this interval not displayed. No results found for: TSH Lab Results  Component Value Date   HGBA1C 5.5 03/19/2016   Lab Results  Component Value Date   CHOL 170 03/20/2016   HDL 38 (L) 03/20/2016   LDLCALC 111 (H) 03/20/2016   TRIG 104 03/20/2016   CHOLHDL 4.5  03/20/2016   Assessment/Plan Insomnia  Start Melatonin 3 mg Tablet daily at bedtime.   Afib  Per Dr. Glade Lloyd patient's recent CT scan report from Neurology stable compared to previous CT scan. Patient okay to restart EliQuis 5 mg Tablet twice daily. EliQuis 5 mg Tablet twice daily ordered. Continue diltiazem 30 mg tablet.    Family/ staff Communication: Reviewed plan of care with Dr. Glade Lloyd and facility Nurse Supervisor.   Labs/tests  ordered: None

## 2016-04-16 ENCOUNTER — Encounter: Payer: Self-pay | Admitting: Family

## 2016-04-16 ENCOUNTER — Non-Acute Institutional Stay (SKILLED_NURSING_FACILITY): Payer: Medicare Other | Admitting: Family

## 2016-04-16 DIAGNOSIS — I5022 Chronic systolic (congestive) heart failure: Secondary | ICD-10-CM | POA: Diagnosis not present

## 2016-04-16 DIAGNOSIS — I63512 Cerebral infarction due to unspecified occlusion or stenosis of left middle cerebral artery: Secondary | ICD-10-CM | POA: Diagnosis not present

## 2016-04-16 DIAGNOSIS — N183 Chronic kidney disease, stage 3 unspecified: Secondary | ICD-10-CM

## 2016-04-16 DIAGNOSIS — K5901 Slow transit constipation: Secondary | ICD-10-CM

## 2016-04-16 DIAGNOSIS — R790 Abnormal level of blood mineral: Secondary | ICD-10-CM | POA: Diagnosis not present

## 2016-04-16 DIAGNOSIS — M79604 Pain in right leg: Secondary | ICD-10-CM | POA: Diagnosis not present

## 2016-04-16 DIAGNOSIS — I4891 Unspecified atrial fibrillation: Secondary | ICD-10-CM | POA: Diagnosis not present

## 2016-04-16 DIAGNOSIS — G4700 Insomnia, unspecified: Secondary | ICD-10-CM | POA: Diagnosis not present

## 2016-04-16 DIAGNOSIS — R2681 Unsteadiness on feet: Secondary | ICD-10-CM | POA: Diagnosis not present

## 2016-04-16 NOTE — Progress Notes (Signed)
Location:  [facility] Room Number: 106-P Place of Service:  SNF (31)  Provider: Richarda Blade FNP-C   PCP: No primary care provider on file. No care team member to display  Extended Emergency Contact Information Primary Emergency Contact: Bryant,Michael Address: [address]          [place], [place] [address] [place] Home Phone: [phone] Relation: Son Secondary Emergency Contact: [place] Mobile Phone: [phone] Relation: Daughter  Code Status: DNR  Goals of care:  Advanced Directive information Advanced Directives 04/02/2016  Does Patient Have a Medical Advance Directive? Yes  Type of Advance Directive Out of facility DNR (pink MOST or yellow form)  Does patient want to make changes to medical advance directive? No - Patient declined     No Known Allergies  Chief Complaint  Patient presents with  . Discharge Note    Discharge Visit    HPI:  82 y.o. female  Seen today at [facility] for discharge home in [place].She was here for short term rehabilitation post hospital admission from 03/19/16-03/25/16 with acute left arterial ischemic stroke of MCA. Neurology was consulted. She was also noted to have subdural vs subarachnoid bleed. Her eliquis was held. She was placed on statin and baby aspirin. She has medical history of Afib, CHF, HTN, TIA, seizures among others. She is seen in her room today. She has global aphasia and Right sided weakness from this CVA. She does not participate in HPI and ROS. She has worked with PT/OT now stable for discharge home.She will be discharged home with Home health PT/OT to continue with ROM, Exercise and muscle strengthening.She will also require a HH RN for education and disease management. ST for aphasia.   She will require  DME high back reclining wheelchair cushion, anti tippers, extended brake handles, removable elevating leg rests  to  enable her to maintain current level of independence. She will also require a semi Electric Hospital bed  with  rails to alleviate pain by allowing back and hip to be repositioned in ways not feasible with a normal bed. Patient suffers from right leg pain.Pain episodes frequently require frequent and immediate changes in position which cannot be achieved with a normal bed.She will need a Pressure relieving Mattress due to immobility secondary to recent CVA.She will also require a 3-1 bedside commode. Patient unable to safely and independently perform toileting transfer in home with unsteady gait and right side weakness due to CVA. Home health services will be arranged by facility social worker prior to discharge. Prescription medication will be written x 1 month then patient to follow up with PCP in 1-2 weeks.Facility staff report no new concerns.       Past Medical History:  Diagnosis Date  . Atrial fibrillation (HCC)   . Hypertension   . Seizures (HCC)   . Stroke [facility])     Past Surgical History:  Procedure Laterality Date  . NO PAST SURGERIES        reports that she has never smoked. She has never used smokeless tobacco. She reports that she does not drink alcohol or use drugs. Social History   Social History  . Marital status: Married    Spouse name: N/A  . Number of children: N/A  . Years of education: N/A   Occupational History  . Not on file.   Social History Main Topics  . Smoking status: Never Smoker  . Smokeless  tobacco: Never Used  . Alcohol use No  . Drug use: No  . Sexual activity: Not on file   Other Topics Concern  . Not on file   Social History Narrative  . No narrative on file      No Known Allergies  Pertinent  Health Maintenance Due  Topic Date Due  . DEXA SCAN  12/27/1992  . PNA vac Low Risk Adult (1 of 2 - PCV13) 12/27/1992  . INFLUENZA VACCINE  12/11/2015    Medications:   Medication List       Accurate as of 04/16/16  2:05 PM. Always use  your most recent med list.          acetaminophen 325 MG tablet Commonly known as:  TYLENOL Take 650 mg by mouth every 4 (four) hours.   amLODipine 5 MG tablet Commonly known as:  NORVASC Take 5 mg by mouth daily.   atorvastatin 40 MG tablet Commonly known as:  LIPITOR Take 1 tablet (40 mg total) by mouth daily at 6 PM.   carvedilol 3.125 MG tablet Commonly known as:  COREG Take 2 tablets (6.25 mg total) by mouth 2 (two) times daily with a meal.   diltiazem 30 MG tablet Commonly known as:  CARDIZEM Take 30 mg by mouth every 8 (eight) hours.   ELIQUIS 5 MG Tabs tablet Generic drug:  apixaban Take 5 mg by mouth 2 (two) times daily.   isosorbide mononitrate 30 MG 24 hr tablet Commonly known as:  IMDUR Take 30 mg by mouth daily.   levETIRAcetam 500 MG tablet Commonly known as:  KEPPRA Take 750 mg by mouth 2 (two) times daily.   lisinopril 2.5 MG tablet Commonly known as:  PRINIVIL,ZESTRIL Take 1 tablet (2.5 mg total) by mouth daily.   magnesium oxide 400 MG tablet Commonly known as:  MAG-OX Take 400 mg by mouth daily.   sennosides-docusate sodium 8.6-50 MG tablet Commonly known as:  SENOKOT-S Take 1 tablet by mouth at bedtime.   traZODone 50 MG tablet Commonly known as:  DESYREL Take 0.5 tablets (25 mg total) by mouth at bedtime as needed for sleep.   UNABLE TO FIND Med Name: Magic cup daily with lunch       Review of Systems  Unable to perform ROS: Patient nonverbal    Vitals:   04/16/16 1058  BP: 104/64  Pulse: 85  Resp: 18  Temp: 97.4 F (36.3 C)  TempSrc: Oral  SpO2: 97%  Weight: 179 lb 9.6 oz (81.5 kg)  Height: 5\' 5"  (1.651 m)   Body mass index is 29.89 kg/m. Physical Exam  Constitutional: She appears well-developed and well-nourished. No distress.  Non-verbal during visit.   HENT:  Head: Normocephalic.  Mouth/Throat: Oropharynx is clear and moist. No oropharyngeal exudate.  Eyes: Conjunctivae and EOM are normal. Pupils are equal,  round, and reactive to light. Right eye exhibits no discharge. Left eye exhibits no discharge. No scleral icterus.  Neck: Normal range of motion. No JVD present. No thyromegaly present.  Cardiovascular: Normal rate, regular rhythm, normal heart sounds and intact distal pulses.  Exam reveals no gallop and no friction rub.   No murmur heard. Pulmonary/Chest: Effort normal and breath sounds normal. No respiratory distress. She has no wheezes.  Abdominal: Soft. Bowel sounds are normal. She exhibits no distension. There is no tenderness. There is no rebound and no guarding.  Genitourinary:  Genitourinary Comments: incontinent  Musculoskeletal: She exhibits no edema, tenderness or deformity.  Right side  weakness   Lymphadenopathy:    She has no cervical adenopathy.  Neurological: She is alert.  Nonverbal but nods head for yes or no questions.   Skin: Skin is warm and dry. No rash noted. No erythema. No pallor.  Psychiatric: She has a normal mood and affect.   Labs reviewed: Basic Metabolic Panel:  Recent Labs  91/47/82 0531 03/22/16 0405 03/24/16 0541 04/07/16  NA 143 143 144 146  K 4.0 4.0 3.9 4.0  CL 113* 113* 113*  --   CO2 23 22 23   --   GLUCOSE 93 115* 110*  --   BUN 20 21* 25* 19  CREATININE 1.01* 1.05* 1.03* 0.9  CALCIUM 8.6* 8.9 8.8*  --    Liver Function Tests:  Recent Labs  03/19/16 0632 04/07/16  AST 27 22  ALT 18 23  ALKPHOS 52 77  BILITOT 1.0  --   PROT 6.7  --   ALBUMIN 4.2  --    CBC:  Recent Labs  03/19/16 0632  03/21/16 1021 03/22/16 0405 03/24/16 0541 04/07/16  WBC 9.8  < > 8.1 8.5 7.5 9.0  NEUTROABS 8.2*  --   --   --   --   --   HGB 10.4*  < > 8.2* 7.9* 8.4* 9.7*  HCT 31.4*  < > 25.5* 24.3* 26.2* 31*  MCV 89.7  < > 91.4 91.4 91.3  --   PLT 241  < > 168 202 190 317  < > = values in this interval not displayed. Cardiac Enzymes:  Recent Labs  03/19/16 0752  CKTOTAL 132   Assessment/Plan:   1. Chronic systolic CHF (congestive heart  failure)  Stable. No recent abrupt weight changes. Exam findings negative for wheezing, shortness of breath or rales. No edema noted. Continue on Lisinopril,coreg, Imdur and diltiazem. Monitor weight daily. Fluid restriction 1.5 liters.   2. Atrial fibrillation with RVR  HR controlled. Continue on EliQuis,coreg, Imdur and diltiazem.  3. Left acute arterial ischemic stroke, MCA (middle cerebral artery) Continue on EliQuis.Will Require a HH RN for education and disease management. ST for aphasia. Aspiration precautions.    4. CKD (chronic kidney disease) stage 3, GFR 30-59 ml/min BMP in 1-2 weeks with PCP  5. Unsteady gait Has worked with PT/OT will be discharged home with Home health PT/OT to continue with ROM, Exercise and muscle strengthening. She will require  DME high back reclining wheelchair cushion, anti tippers, extended brake handles, removable elevating leg rests  to enable her to maintain current level of independence. She will also require a semi Electric Hospital bed  with  rails to alleviate pain by allowing back and hip to be repositioned in ways not feasible with a normal bed. Patient suffers from right leg pain.Pain episodes frequently require frequent and immediate changes in position which cannot be achieved with a normal bed.She will also require a 3-1 bedside commode. Patient unable to safely and independently perform toileting transfer in home with unsteady gait and right side weakness due to CVA.Fall and safety precautions.   6. Right leg pain Continue on Tylenol as needed.   7. Insomnia Continue on Trazodone at bedtime as needed.   8. Slow transit constipation Current regimen effective.   9. Low blood magnesium level Continue on magnesium supplements. Mg level in 1-2 weeks with PCP  Patient is being discharged with the following home health services:    -PT/OT continue with ROM, Exercise and muscle strengthening  -ST for aphasia and swallowing  studies.    -RN  for education and disease management.   Patient is being discharged with the following durable medical equipment:   -Hospital bed with split rails -3-1 commode -Shower Chair -High back reclining wheelchair with elevated leg rest, anti-tippers, cushion and extended handle brakes. -Pressure relieving Mattress  Patient has been advised to f/u with their PCP in 1-2 weeks to for a transitions of care visit.  Social services at their facility was responsible for arranging this appointment.  Pt was provided with adequate prescriptions of noncontrolled medications to reach the scheduled appointment .  For controlled substances, a limited supply was provided as appropriate for the individual patient.  If the pt normally receives these medications from a pain clinic or has a contract with another physician, these medications should be received from that clinic or physician only).    Future labs/tests needed:  CBC, BMP and Mg level in 1-2 weeks with PCP

## 2016-08-10 DEATH — deceased

## 2017-10-18 IMAGING — CT CT ANGIO NECK
1 of 12 series · 5 of 33 positions shown · IV contrast (isovue)
Comparison: Yesterday

CLINICAL DATA: Found on floor yesterday.  Stroke.

EXAM:
CT ANGIOGRAPHY HEAD AND NECK
TECHNIQUE: Multidetector CT imaging of the head and neck was performed using
the standard protocol during bolus administration of intravenous
contrast. Multiplanar CT image reconstructions and MIPs were
obtained to evaluate the vascular anatomy. Carotid stenosis
measurements (when applicable) are obtained utilizing NASCET
criteria, using the distal internal carotid diameter as the
denominator.
CONTRAST:  80 cc Isovue 370 intravenous

[Series 11: cta neck axial · axial · 0.54mm/px · z∈[-565,-355]mm · 5 of 319 slices shown]
[im 54/319  soft-tissue]
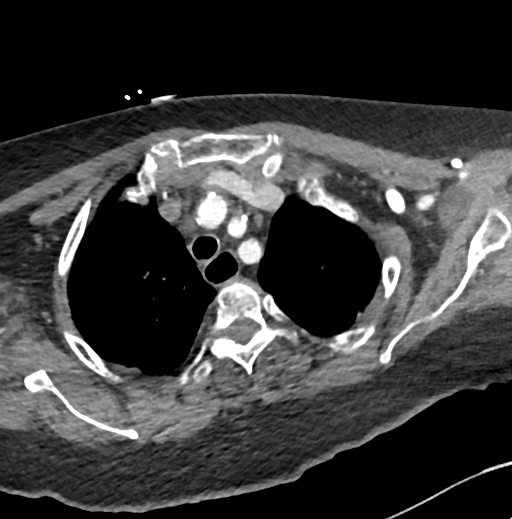
[im 107/319  bone]
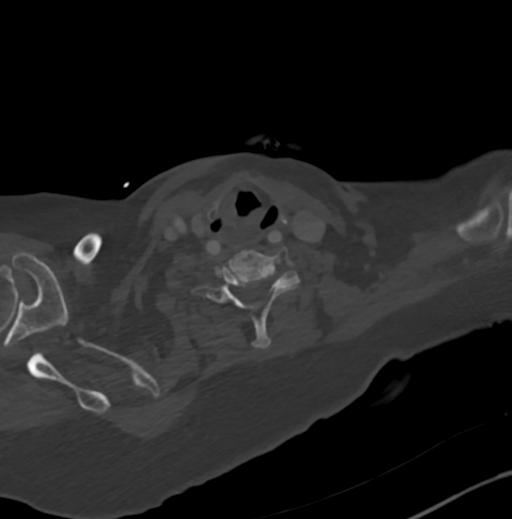
[im 160/319  soft-tissue]
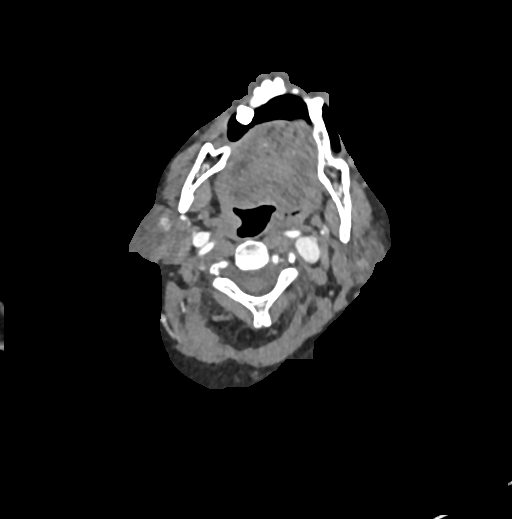
[im 213/319  bone]
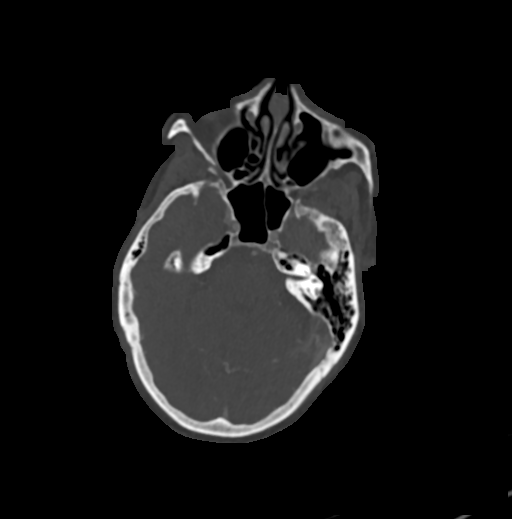
[im 266/319  soft-tissue]
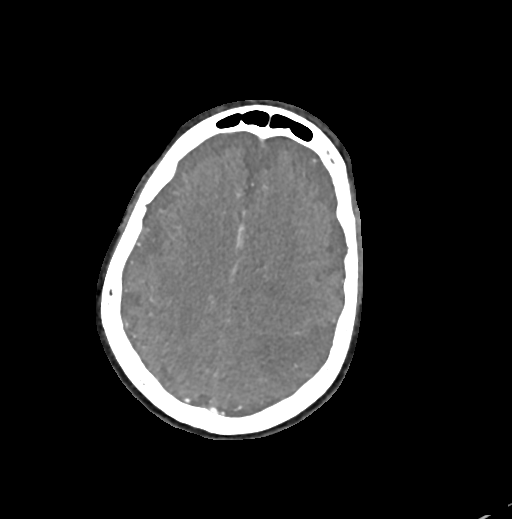

[5 of 33 positions shown; findings below may reference images not displayed]

FINDINGS: CT HEAD FINDINGS

Brain: Sizable infarct in the posterior division left MCA territory
with more well-defined cytotoxic edema. Local mass effect without
midline shift. No hemorrhagic conversion. The right superior
cerebellar infarct by MRI is not clearly seen by CT. Stable
intermittently seen extra-axial hemorrhage around the right cerebral
convexity. Stable CSF density prominence around the cerebral
convexities, possible hygromas.

Vascular: No proximal hyper dense vessel.

Skull: Negative for fracture

Sinuses: Mucous retention cysts in the left maxillary antrum.

Orbits: No acute finding

Review of the MIP images confirms the above findings

CTA NECK FINDINGS

Aortic arch: Atherosclerosis without acute finding.  No ulceration.

Right carotid system: Predominant calcified plaque brachiocephalic
origin. Carotid branches are smooth and widely patent. No flow
limiting stenosis.

Left carotid system: Atherosclerotic plaque at the origin. No
stenosis, dissection, or ulceration

Vertebral arteries: Symmetric vertebral arteries, smooth and widely
patent. No proximal subclavian stenosis.

Skeleton: No acute or aggressive finding. Usual degenerative
changes.

Other neck: No incidental mass or adenopathy

Upper chest: Mosaic attenuation lungs favoring air trapping given
degree of airway thickening and intermittent airway collapse.

Review of the MIP images confirms the above findings

CTA HEAD FINDINGS

Anterior circulation: Symmetric carotid arteries. Left M2 branch
occlusion correlating with the acute infarct. No more proximal flow
limiting stenosis. No other major branch occlusion. Negative for
aneurysm

Posterior circulation: Unremarkable vertebrobasilar branching. Mild
atherosclerotic luminal irregularity. No major branch occlusion or
treatable flow limiting stenosis.

Venous sinuses: Patent

Anatomic variants: None significant

Delayed phase: No parenchymal enhancement or mass.

Review of the MIP images confirms the above findings
IMPRESSION: 1. Distal left M2 branch occlusion correlating with acute left MCA
territory infarct. No more proximal flow limiting stenosis.
2. Atherosclerosis that is overall mild for age. No stenosis or
embolic source seen in the neck.
3. Stable small volume extra-axial hemorrhage around the right
cerebral convexity.
4. Heterogeneous lung aeration, likely small airways disease with
air trapping.
